# Patient Record
Sex: Male | Born: 2005 | Race: Black or African American | Hispanic: No | Marital: Single | Smoking: Never smoker
Health system: Southern US, Community
[De-identification: ages and names within clinical notes are randomized; demographics above are authoritative.]

## PROBLEM LIST (undated history)

## (undated) DIAGNOSIS — J45909 Unspecified asthma, uncomplicated: Secondary | ICD-10-CM

## (undated) DIAGNOSIS — I514 Myocarditis, unspecified: Secondary | ICD-10-CM

---
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
## 2023-01-15 ENCOUNTER — Ambulatory Visit: Payer: Self-pay | Admitting: Nurse Practitioner

## 2023-02-16 ENCOUNTER — Other Ambulatory Visit: Payer: Self-pay

## 2023-02-16 ENCOUNTER — Emergency Department (HOSPITAL_COMMUNITY)
Admission: EM | Admit: 2023-02-16 | Discharge: 2023-02-17 | Disposition: A | Discharge status: Home / Self Care | Payer: Medicaid Other | Attending: Emergency Medicine | Admitting: Emergency Medicine

## 2023-02-16 ENCOUNTER — Emergency Department (HOSPITAL_COMMUNITY): Payer: Medicaid Other

## 2023-02-16 ENCOUNTER — Encounter (HOSPITAL_COMMUNITY): Payer: Self-pay | Admitting: *Deleted

## 2023-02-16 DIAGNOSIS — R079 Chest pain, unspecified: Secondary | ICD-10-CM | POA: Diagnosis present

## 2023-02-16 DIAGNOSIS — R197 Diarrhea, unspecified: Secondary | ICD-10-CM | POA: Diagnosis not present

## 2023-02-16 LAB — BASIC METABOLIC PANEL
Anion gap: 12 (ref 5–15)
BUN: 10 mg/dL (ref 6–20)
CO2: 26 mmol/L (ref 22–32)
Calcium: 10.1 mg/dL (ref 8.9–10.3)
Chloride: 101 mmol/L (ref 98–111)
Creatinine, Ser: 1.09 mg/dL (ref 0.61–1.24)
GFR, Estimated: >60 mL/min (ref >60)
Glucose, Bld: 94 mg/dL (ref 70–99)
Potassium: 4.1 mmol/L (ref 3.5–5.1)
Sodium: 139 mmol/L (ref 135–145)

## 2023-02-16 LAB — CBC
HCT: 48.2 % (ref 39.0–52.0)
Hemoglobin: 16.1 g/dL (ref 13.0–17.0)
MCH: 28.8 pg (ref 26.0–34.0)
MCHC: 33.4 g/dL (ref 30.0–36.0)
MCV: 86.1 fL (ref 80.0–100.0)
Platelets: 332 10*3/uL (ref 150–400)
RBC: 5.6 MIL/uL (ref 4.22–5.81)
RDW: 12.9 % (ref 11.5–15.5)
WBC: 7.6 10*3/uL (ref 4.0–10.5)
nRBC: 0 % (ref 0.0–0.2)

## 2023-02-16 LAB — TROPONIN I (HIGH SENSITIVITY)
Troponin I (High Sensitivity): 5 ng/L (ref <18)
Troponin I (High Sensitivity): 6 ng/L (ref <18)

## 2023-02-16 NOTE — ED Triage Notes (Signed)
 The pt has had chest pain for 4 days  he has a history of myocarditis  he is followed by [facility] hospital

## 2023-02-17 NOTE — ED Provider Notes (Signed)
 [place] EMERGENCY DEPARTMENT AT [facility] Provider Note   CSN: [id number] Arrival date & time: 02/16/23  8161     History  Chief Complaint  Patient presents with   Chest Pain    Kevin Holloway is a 18 y.o. male.  The history is provided by the patient.  Chest Pain She has history of hyperlipidemia, patent ductus arteriosus, myocarditis and comes in complaining of chest pains for the last 3 days.  Pain is present primarily when he wakes up.  He states it is left parasternal and is a dull, achy pain.  He takes a dose of aspirin , and pain will go away.  Pain is not worsened by deep breath, movement, exertion, change in body position.  He denies fever, chills, sweats.  He denies nausea, vomiting, dyspnea.  He did have some loose stools 2 days ago, but has not had any diarrhea since then.   Home Medications Prior to Admission medications   Medication Sig Start Date End Date Taking? Authorizing Provider  albuterol  (VENTOLIN  HFA) 108 (90 Base) MCG/ACT inhaler Inhale 2 puffs into the lungs every 4 (four) hours as needed. 11/24/18   [provider]  beclomethasone (QVAR) 40 MCG/ACT inhaler Inhale 2 puffs into the lungs as needed.    [provider]  FLOVENT  HFA 44 MCG/ACT inhaler Inhale 2 puffs into the lungs 2 (two) times daily. 02/03/19   [provider]  fluticasone  (VERAMYST) 27.5 MCG/SPRAY nasal spray Place 2 sprays into the nose daily.    [provider]  montelukast (SINGULAIR) 5 MG chewable tablet Chew 5 mg by mouth at bedtime.    [provider]  ondansetron  (ZOFRAN ) 4 MG tablet Take 1 tablet (4 mg total) by mouth every 8 (eight) hours as needed for nausea or vomiting. 02/09/20   Maranda Jamee Jacob, MD  UNKNOWN TO PATIENT Eczema cream    [provider]  cetirizine  (ZYRTEC ) 10 MG chewable tablet Chew 1 tablet (10 mg total) by mouth daily. 04/16/18 02/09/20  Wurst, Laymon, PA-C  famotidine  (PEPCID ) 20 MG tablet Take 1  tablet (20 mg total) by mouth 2 (two) times daily. 03/11/19 02/09/20  Van Knee, MD  fluticasone  (FLONASE ) 50 MCG/ACT nasal spray Place 1 spray into both nostrils daily. 04/16/18 02/09/20  Martell Laymon, PA-C      Allergies    Patient has no known allergies.    Review of Systems   Review of Systems  Cardiovascular:  Positive for chest pain.  All other systems reviewed and are negative.   Physical Exam Updated Vital Signs BP 138/82 (BP Location: Left Arm)   Pulse 83   Temp 98.2 F (36.8 C) (Oral)   Resp 15   Ht 5' 9 (1.753 m)   Wt 94.7 kg   SpO2 100%   BMI 30.83 kg/m  Physical Exam Vitals and nursing note reviewed.   18 year old male, resting comfortably and in no acute distress. Vital signs are normal. Oxygen saturation is 100%, which is normal. Head is normocephalic and atraumatic. PERRLA, EOMI. Oropharynx is clear. Neck is nontender and supple. Back is nontender. Lungs are clear without rales, wheezes, or rhonchi. Chest is nontender. Heart has regular rate and rhythm without murmur. Abdomen is soft, flat, nontender. Extremities have no cyanosis or edema, full range of motion is present. Skin is warm and dry without rash. Neurologic: Mental status is normal, moves all extremities equally.  ED Results / Procedures / Treatments   Labs (all labs  ordered are listed, but only abnormal results are displayed) Labs Reviewed  BASIC METABOLIC PANEL  CBC  TROPONIN I (HIGH SENSITIVITY)  TROPONIN I (HIGH SENSITIVITY)    EKG EKG Interpretation Date/Time:  Saturday February 16 2023 19:02:34 EST Ventricular Rate:  81 PR Interval:  124 QRS Duration:  94 QT Interval:  348 QTC Calculation: 404 R Axis:   78  Text Interpretation: Normal sinus rhythm with sinus arrhythmia Inferior infarct , age undetermined Abnormal ECG When compared with ECG of 18-Aug-2022 18:25, Inferior Q waves are less prominent T wave abnormality is less prominent Confirmed by Raford Lenis ([id number]) on  02/17/2023 12:03:16 AM  Radiology DG Chest 2 View Result Date: 02/16/2023 CLINICAL DATA:  Chest pain for 4 days. EXAM: CHEST - 2 VIEW COMPARISON:  08/18/2022 FINDINGS: Normal heart size and pulmonary vascularity. No focal airspace disease or consolidation in the lungs. No blunting of costophrenic angles. No pneumothorax. Mediastinal contours appear intact. IMPRESSION: No active cardiopulmonary disease. Electronically Signed   By: Elsie Gravely M.D.   On: 02/16/2023 19:39    Procedures Procedures  Cardiac monitor shows normal sinus rhythm, per my interpretation.  Medications Ordered in ED Medications - No data to display  ED Course/ Medical Decision Making/ A&P                                 Medical Decision Making  Nonspecific chest pain.  Etiology is unclear.  Doubt ACS, pulmonary embolism, pneumonia.  History of myocarditis, but this pain is not typical of myocarditis.  I have reviewed his electrocardiogram, and my interpretation is nonspecific T wave changes which are improved compared with prior.  Chest x-ray shows no acute cardiopulmonary process.  Have independently viewed the images, and agree with radiologist's interpretation.  I reviewed his laboratory test, my interpretation is normal CBC, basic metabolic panel, troponin x 2.  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  I have reassured the patient and his mother that there is no sign of recurren myocarditis, advised to continue symptomatic treatment with over-the-counter NSAIDs and acetaminophen .  Return precautions discussed.  Final Clinical Impression(s) / ED Diagnoses Final diagnoses:  Nonspecific chest pain    Rx / DC Orders ED Discharge Orders     None         Raford Lenis, MD 02/17/23 0020

## 2023-02-17 NOTE — Discharge Instructions (Signed)
 Your evaluation did not show any sign of serious problems causing your pain, but the specific cause is not clear.  You may take aspirin , ibuprofen , or naproxen as needed for pain.  If you need additional pain relief, you may add acetaminophen .  Return to the emergency department if you have new or concerning symptoms.

## 2023-05-13 ENCOUNTER — Other Ambulatory Visit: Payer: Self-pay

## 2023-05-13 ENCOUNTER — Ambulatory Visit: Payer: Medicaid Other | Attending: Family Medicine | Admitting: Family Medicine

## 2023-05-13 ENCOUNTER — Encounter: Payer: Self-pay | Admitting: Family Medicine

## 2023-05-13 VITALS — BP 118/79 | HR 61 | Ht 69.0 in | Wt 210.0 lb

## 2023-05-13 DIAGNOSIS — Z1159 Encounter for screening for other viral diseases: Secondary | ICD-10-CM

## 2023-05-13 DIAGNOSIS — Z131 Encounter for screening for diabetes mellitus: Secondary | ICD-10-CM | POA: Diagnosis not present

## 2023-05-13 DIAGNOSIS — G4709 Other insomnia: Secondary | ICD-10-CM

## 2023-05-13 DIAGNOSIS — M549 Dorsalgia, unspecified: Secondary | ICD-10-CM | POA: Insufficient documentation

## 2023-05-13 DIAGNOSIS — J45909 Unspecified asthma, uncomplicated: Secondary | ICD-10-CM | POA: Diagnosis not present

## 2023-05-13 DIAGNOSIS — Z13228 Encounter for screening for other metabolic disorders: Secondary | ICD-10-CM

## 2023-05-13 DIAGNOSIS — Z139 Encounter for screening, unspecified: Secondary | ICD-10-CM

## 2023-05-13 DIAGNOSIS — Z8679 Personal history of other diseases of the circulatory system: Secondary | ICD-10-CM | POA: Insufficient documentation

## 2023-05-13 DIAGNOSIS — L309 Dermatitis, unspecified: Secondary | ICD-10-CM | POA: Insufficient documentation

## 2023-05-13 MED ORDER — CETIRIZINE HCL 10 MG PO CHEW: 10.0000 mg | CHEWABLE_TABLET | Freq: Every day | ORAL | Status: AC

## 2023-05-13 MED ORDER — ALBUTEROL SULFATE HFA 108 (90 BASE) MCG/ACT IN AERS
2.0000 | INHALATION_SPRAY | RESPIRATORY_TRACT | 6 refills | Status: AC | PRN
  Filled 2023-05-13: qty 18, 17d supply, fill #0

## 2023-05-13 NOTE — Progress Notes (Signed)
 Subjective:  Patient ID: Kevin Holloway, male    DOB: December 03, 2005  Age: 18 y.o. MRN: [id number]  CC: New Patient (Initial Visit) (Back pain/Requesting lab work/)     Discussed the use of AI scribe software for clinical note transcription with the patient, who gave verbal consent to proceed.  History of Present Illness The patient, with a history of asthma, eczema and myocarditis, is establishing care with the new clinic. The patient has been experiencing back pain for about two years, which worsens with standing or walking for more than eight minutes. The pain, rated as a 5/10, is located in the middle of the lower back and does not radiate down the legs. The patient finds relief by sitting and resting.  The patient's mother also reports that the patient has been having sleep issues.  He is unable to fall asleep until early hours of the morning stays asleep until 4 into the morning.  Denies caffeine intake or daytime naps.  The patient has a history of chest pain and was seen in the emergency room for this in 02/2023 and myocarditis was ruled out at that time.   For his eczema he uses triamcinolone and for his asthma he is on Ventolin as needed but not on any maintenance treatment.  He uses cetirizine and Flonase for seasonal allergies.    Past Medical History:  Diagnosis Date   Asthma    Myocarditis (HCC)     No past surgical history on file.  Family History  Problem Relation Age of Onset   Healthy Mother    Healthy Father    Cancer Paternal Grandmother     Social History   Socioeconomic History   Marital status: Single    Spouse name: Not on file   Number of children: Not on file   Years of education: Not on file   Highest education level: Not on file  Occupational History   Not on file  Tobacco Use   Smoking status: Never   Smokeless tobacco: Never  Vaping Use   Vaping status: Never Used  Substance and Sexual Activity   Alcohol use: Never   Drug use: Not on file    Sexual activity: Never  Other Topics Concern   Not on file  Social History Narrative   Not on file   Social Drivers of Health   Financial Resource Strain: Medium Risk (05/13/2023)   Overall Financial Resource Strain (CARDIA)    Difficulty of Paying Living Expenses: Somewhat hard  Food Insecurity: Food Insecurity Present (05/13/2023)   Hunger Vital Sign    Worried About Running Out of Food in the Last Year: Sometimes true    Ran Out of Food in the Last Year: Sometimes true  Transportation Needs: Unmet Transportation Needs (05/13/2023)   PRAPARE - Transportation    Lack of Transportation (Medical): Yes    Lack of Transportation (Non-Medical): Yes  Physical Activity: Patient Declined (05/13/2023)   Exercise Vital Sign    Days of Exercise per Week: Patient declined    Minutes of Exercise per Session: Patient declined  Stress: Stress Concern Present (05/13/2023)   [employer] of Occupational Health - Occupational Stress Questionnaire    Feeling of Stress : To some extent  Social Connections: Moderately Integrated (05/13/2023)   Social Connection and Isolation Panel [NHANES]    Frequency of Communication with Friends and Family: Twice a week    Frequency of Social Gatherings with Friends and Family: Twice a week  Attends Religious Services: 1 to 4 times per year    Active Member of Clubs or Organizations: Yes    Attends Banker Meetings: Never    Marital Status: Never married    No Known Allergies  Outpatient Medications Prior to Visit  Medication Sig Dispense Refill   fluticasone (VERAMYST) 27.5 MCG/SPRAY nasal spray Place 2 sprays into the nose daily.     ondansetron (ZOFRAN) 4 MG tablet Take 1 tablet (4 mg total) by mouth every 8 (eight) hours as needed for nausea or vomiting. 12 tablet 0   UNKNOWN TO PATIENT Eczema cream     albuterol (VENTOLIN HFA) 108 (90 Base) MCG/ACT inhaler Inhale 2 puffs into the lungs every 4 (four) hours as needed.     beclomethasone  (QVAR) 40 MCG/ACT inhaler Inhale 2 puffs into the lungs as needed.     FLOVENT HFA 44 MCG/ACT inhaler Inhale 2 puffs into the lungs 2 (two) times daily.     montelukast (SINGULAIR) 5 MG chewable tablet Chew 5 mg by mouth at bedtime.     No facility-administered medications prior to visit.     ROS Review of Systems  Constitutional:  Negative for activity change and appetite change.  HENT:  Negative for sinus pressure and sore throat.   Respiratory:  Negative for chest tightness, shortness of breath and wheezing.   Cardiovascular:  Negative for chest pain and palpitations.  Gastrointestinal:  Negative for abdominal distention, abdominal pain and constipation.  Genitourinary: Negative.   Musculoskeletal:  Positive for back pain.  Skin:  Positive for rash.  Psychiatric/Behavioral:  Negative for behavioral problems and dysphoric mood.     Objective:  BP 118/79   Pulse 61   Ht 5\' 9"  (1.753 m)   Wt 210 lb (95.3 kg)   SpO2 100%   BMI 31.01 kg/m      05/13/2023    8:48 AM 02/17/2023   12:10 AM 02/16/2023   10:57 PM  BP/Weight  Systolic BP 118 132 138  Diastolic BP 79 83 82  Wt. (Lbs) 210    BMI 31.01 kg/m2        Physical Exam Constitutional:      Appearance: He is well-developed.  HENT:     Right Ear: There is impacted cerumen.     Left Ear: There is impacted cerumen.     Mouth/Throat:     Mouth: Mucous membranes are moist.  Cardiovascular:     Rate and Rhythm: Normal rate.     Heart sounds: Normal heart sounds. No murmur heard. Pulmonary:     Effort: Pulmonary effort is normal.     Breath sounds: Normal breath sounds. No wheezing or rales.  Chest:     Chest wall: No tenderness.  Abdominal:     General: Bowel sounds are normal. There is no distension.     Palpations: Abdomen is soft. There is no mass.     Tenderness: There is no abdominal tenderness.  Musculoskeletal:        General: Normal range of motion.     Right lower leg: No edema.     Left lower leg: No  edema.     Comments: No tenderness on lumbar spine palpation, negative straight leg raise bilaterally  Skin:    Comments: Lower back with hyperpigmented rough scaly rash  Neurological:     Mental Status: He is alert and oriented to person, place, and time.  Psychiatric:        Mood and  Affect: Mood normal.        Latest Ref Rng & Units 02/16/2023    7:33 PM 08/18/2022    6:55 PM 10/06/2021    9:15 PM  CMP  Glucose 70 - 99 mg/dL 94  132  440   BUN 6 - 20 mg/dL 10  7  12    Creatinine 0.61 - 1.24 mg/dL 1.02  7.25  3.66   Sodium 135 - 145 mmol/L 139  135  138   Potassium 3.5 - 5.1 mmol/L 4.1  3.8  3.9   Chloride 98 - 111 mmol/L 101  99  104   CO2 22 - 32 mmol/L 26  26  28    Calcium 8.9 - 10.3 mg/dL 44.0  9.5  9.9     Lipid Panel  No results found for: "CHOL", "TRIG", "HDL", "CHOLHDL", "VLDL", "LDLCALC", "LDLDIRECT"  CBC    Component Value Date/Time   WBC 7.6 02/16/2023 1933   RBC 5.60 02/16/2023 1933   HGB 16.1 02/16/2023 1933   HCT 48.2 02/16/2023 1933   PLT 332 02/16/2023 1933   MCV 86.1 02/16/2023 1933   MCH 28.8 02/16/2023 1933   MCHC 33.4 02/16/2023 1933   RDW 12.9 02/16/2023 1933   LYMPHSABS 1.8 10/06/2021 2115   MONOABS 0.6 10/06/2021 2115   EOSABS 0.1 10/06/2021 2115   BASOSABS 0.0 10/06/2021 2115    No results found for: "HGBA1C"     Assessment & Plan History of Myocarditis No current chest pain. Last cardiologist visit last year.  ED visit was in 02/2023 for chest pain but myocarditis was ruled out - Order comprehensive metabolic panel, cholesterol screening, diabetes screening, hepatitis C screening, and HIV screening.  Chronic lower back pain Chronic pain rated 5/10, exacerbated by standing or walking, relieved by rest. Differential includes musculoskeletal issues or structural abnormalities. - Order back x-ray. - Consider physical therapy if x-ray is normal. - Refer to orthopedics if x-ray shows abnormalities. - Advise Tylenol or ibuprofen for pain  management if needed.  Asthma Asthma controlled with Ventolin. Previous prescriptions for Flovent and montelukast not in use. - Refill Ventolin inhaler. - Continue fluticasone nasal spray and cetirizine as needed for seasonal allergies  Eczema Eczema managed with triamcinolone ointment.   Insomnia Explained the sleep pattern of teenage years and we have discussed sleep hygiene including setting a card of time with regards to use of devices as he has been using devices late into the wee hours of the morning. - Encourage exercise in the late afternoon/early evening - Can use melatonin if symptoms persist  Follow-up Plan to assess effectiveness of physical therapy and overall health status. - Schedule follow-up in six months. - Advise to contact clinic for any issues or earlier appointment if needed.      Meds ordered this encounter  Medications   albuterol (VENTOLIN HFA) 108 (90 Base) MCG/ACT inhaler    Sig: Inhale 2 puffs into the lungs every 4 (four) hours as needed.    Dispense:  18 g    Refill:  6   cetirizine (ZYRTEC) 10 MG chewable tablet    Sig: Chew 1 tablet (10 mg total) by mouth daily.    Follow-up: Return in about 6 months (around 11/12/2023) for Back pain.       Hoy Register, MD, FAAFP. [facility] [place], [place] [phone]   05/13/2023, 10:01 AM

## 2023-05-13 NOTE — Patient Instructions (Signed)
 VISIT SUMMARY:  You visited our clinic today to establish care and discuss several health concerns, including back pain, asthma, and a history of myocarditis. We reviewed your symptoms and medical history, and we have outlined a plan to address each of your issues.  YOUR PLAN:  -MYOCARDITIS: Myocarditis is an inflammation of the heart muscle. You are not currently experiencing chest pain, but we will conduct a comprehensive metabolic panel, cholesterol screening, diabetes screening, hepatitis C screening, and HIV screening to monitor your overall health.  Please follow-up with your cardiologist.  -CHRONIC LOWER BACK PAIN: Chronic lower back pain can be due to musculoskeletal issues or structural abnormalities. We will order a back x-ray to investigate the cause. If the x-ray is normal, we may consider physical therapy. If there are abnormalities, we will refer you to an orthopedic specialist. You can take Tylenol or ibuprofen for pain relief as needed.  -ASTHMA: Asthma is a condition where your airways narrow and swell, making it difficult to breathe. Your asthma is currently controlled with Ventolin. We will refill your Ventolin inhaler and you should continue using your fluticasone nasal spray and cetirizine.  -ECZEMA: Eczema is a condition that makes your skin red and itchy. You should continue managing it with triamcinolone ointment.  INSTRUCTIONS:  We will schedule a follow-up appointment in six months to assess the effectiveness of physical therapy and your overall health status. Please contact the clinic if you have any issues or need an earlier appointment.

## 2023-05-14 LAB — HEMOGLOBIN A1C
Est. average glucose Bld gHb Est-mCnc: 114 mg/dL
Hgb A1c MFr Bld: 5.6 % (ref 4.8–5.6)

## 2023-05-14 LAB — CBC WITH DIFFERENTIAL/PLATELET
Basophils Absolute: 0 10*3/uL (ref 0.0–0.2)
Basos: 1 %
EOS (ABSOLUTE): 0.2 10*3/uL (ref 0.0–0.4)
Eos: 2 %
Hematocrit: 46.2 % (ref 37.5–51.0)
Hemoglobin: 15.4 g/dL (ref 13.0–17.7)
Immature Grans (Abs): 0 10*3/uL (ref 0.0–0.1)
Immature Granulocytes: 0 %
Lymphocytes Absolute: 2.7 10*3/uL (ref 0.7–3.1)
Lymphs: 36 %
MCH: 28.7 pg (ref 26.6–33.0)
MCHC: 33.3 g/dL (ref 31.5–35.7)
MCV: 86 fL (ref 79–97)
Monocytes Absolute: 0.6 10*3/uL (ref 0.1–0.9)
Monocytes: 8 %
Neutrophils Absolute: 4 10*3/uL (ref 1.4–7.0)
Neutrophils: 53 %
Platelets: 341 10*3/uL (ref 150–450)
RBC: 5.36 x10E6/uL (ref 4.14–5.80)
RDW: 12.5 % (ref 11.6–15.4)
WBC: 7.5 10*3/uL (ref 3.4–10.8)

## 2023-05-14 LAB — CMP14+EGFR
ALT: 44 IU/L (ref 0–44)
AST: 30 IU/L (ref 0–40)
Albumin: 4.9 g/dL (ref 4.3–5.2)
Alkaline Phosphatase: 88 IU/L (ref 51–125)
BUN/Creatinine Ratio: 12 (ref 9–20)
BUN: 13 mg/dL (ref 6–20)
Bilirubin Total: 0.3 mg/dL (ref 0.0–1.2)
CO2: 22 mmol/L (ref 20–29)
Calcium: 10 mg/dL (ref 8.7–10.2)
Chloride: 102 mmol/L (ref 96–106)
Creatinine, Ser: 1.05 mg/dL (ref 0.76–1.27)
Globulin, Total: 2.8 g/dL (ref 1.5–4.5)
Glucose: 89 mg/dL (ref 70–99)
Potassium: 4.4 mmol/L (ref 3.5–5.2)
Sodium: 139 mmol/L (ref 134–144)
Total Protein: 7.7 g/dL (ref 6.0–8.5)
eGFR: 105 mL/min/{1.73_m2} (ref >59)

## 2023-05-14 LAB — HIV ANTIBODY (ROUTINE TESTING W REFLEX): HIV Screen 4th Generation wRfx: NONREACTIVE

## 2023-05-14 LAB — LP+NON-HDL CHOLESTEROL
Cholesterol, Total: 205 mg/dL — ABNORMAL HIGH (ref 100–169)
HDL: 41 mg/dL (ref >39)
LDL Chol Calc (NIH): 135 mg/dL — ABNORMAL HIGH (ref 0–109)
Total Non-HDL-Chol (LDL+VLDL): 164 mg/dL — ABNORMAL HIGH (ref 0–119)
Triglycerides: 162 mg/dL — ABNORMAL HIGH (ref 0–89)
VLDL Cholesterol Cal: 29 mg/dL (ref 5–40)

## 2023-05-14 LAB — HCV AB W REFLEX TO QUANT PCR: HCV Ab: NONREACTIVE

## 2023-05-14 LAB — HCV INTERPRETATION

## 2023-05-16 ENCOUNTER — Telehealth: Payer: Self-pay | Admitting: *Deleted

## 2023-05-16 NOTE — Progress Notes (Unsigned)
 Complex Care Management Note Care Guide Note  05/16/2023 Name: Kevin Holloway MRN: [id number] DOB: 09-Oct-2005   Complex Care Management Outreach Attempts: An unsuccessful telephone outreach was attempted today to offer the patient information about available complex care management services.  Follow Up Plan:  Additional outreach attempts will be made to offer the patient complex care management information and services.   Encounter Outcome:  No Answer  Gwenevere Ghazi  [facility] Health  [facility], [facility] Guide  Direct Dial: [phone]  Fax [phone]

## 2023-05-17 NOTE — Progress Notes (Signed)
 Complex Care Management Note Care Guide Note  05/17/2023 Name: Kevin Holloway MRN: [id number] DOB: December 11, 2005   Complex Care Management Outreach Attempts: A second unsuccessful outreach was attempted today to offer the patient with information about available complex care management services.  Follow Up Plan:  Additional outreach attempts will be made to offer the patient complex care management information and services.   Encounter Outcome:  No Answer  Gwenevere Ghazi  [facility] Health  [facility], [facility] Guide  Direct Dial: [phone]  Fax [phone]

## 2023-05-17 NOTE — Progress Notes (Signed)
 Complex Care Management Note  Care Guide Note 05/17/2023 Name: Kevin Holloway MRN: [id number] DOB: 02-14-2005  [name] is a 18 y.o. year old male who sees [facility] for primary care. I reached out to [name] by phone today to offer complex care management services.  [name] was given information about Complex Care Management services today including:   The Complex Care Management services include support from the care team which includes your Nurse Care Manager, Clinical Social Worker, or Pharmacist.  The Complex Care Management team is here to help remove barriers to the health concerns and goals most important to you. Complex Care Management services are voluntary, and the patient may decline or stop services at any time by request to their care team member.   Complex Care Management Consent Status: Patient agreed to services and verbal consent obtained.   Follow up plan:  Telephone appointment with complex care management team member scheduled for:  4/18  Referral routed to community resource care guides   Encounter Outcome:  Patient Scheduled  Gwenevere Ghazi  [facility] Health  [facility], [facility] Guide  Direct Dial: [phone]  Fax [phone]

## 2023-05-22 ENCOUNTER — Telehealth: Payer: Self-pay | Admitting: *Deleted

## 2023-05-22 NOTE — Progress Notes (Addendum)
 Complex Care Management Note Care Guide Note  05/22/2023 Name: Kevin Holloway MRN: [id number] DOB: 07/10/2005   Complex Care Management Outreach Attempts: An unsuccessful telephone outreach was attempted today to offer the patient information about available complex care management services.  Follow Up Plan:  Additional outreach attempts will be made to offer the patient complex care management information and services.   Encounter Outcome:  No Answer  [facility] Care Guide  Direct Dial:[phone] Fax:[phone] Website: [email or web address]

## 2023-05-24 ENCOUNTER — Other Ambulatory Visit: Payer: Self-pay

## 2023-05-24 NOTE — Patient Outreach (Signed)
 Complex Care Management   Visit Note  05/24/2023  Name:  Kevin Holloway MRN: [id number] DOB: 11/25/2005  Situation: Referral received for Complex Care Management related to SDOH Barriers:  Food insecurity Lack of essential utilities , lack of motivation for self-care.  Caregiver  and    I obtained verbal consent from Caregiver Parent.  Visit completed with caregiver/mother Terri Fester  on the phone  Background:   Past Medical History:  Diagnosis Date   Asthma    Myocarditis [facility])     Assessment: Patient Reported Symptoms:  Cognitive        Neurological Neurological Review of Symptoms: No symptoms reported    HEENT HEENT Symptoms Reported: No symptoms reported      Cardiovascular Cardiovascular Symptoms Reported: No symptoms reported    Respiratory Respiratory Symptoms Reported: No symptoms reported    Endocrine Patient reports the following symptoms related to hypoglycemia or hyperglycemia : No symptoms reported Is patient diabetic?: No    Gastrointestinal Gastrointestinal Symptoms Reported: No symptoms reported      Genitourinary Genitourinary Symptoms Reported: No symptoms reported    Integumentary Integumentary Symptoms Reported: No symptoms reported    Musculoskeletal Musculoskelatal Symptoms Reviewed: No symptoms reported   Falls in the past year?: No    Psychosocial Psychosocial Symptoms Reported: Other (Mother states "it's difficult to get him out of his room", she prepares all meals for him, he is not motivated to get his license. He was discharged from public school around age of 66, she has been trying to help him apply for disability.)     Quality of Family Relationships: supportive, helpful, involved      05/24/2023    4:58 PM  Depression screen PHQ 2/9  Decreased Interest 2  Down, Depressed, Hopeless 0  PHQ - 2 Score 2  Altered sleeping 1  Change in appetite 0  Feeling bad or failure about yourself  0  Trouble concentrating 0  Moving  slowly or fidgety/restless 0  Suicidal thoughts 0  PHQ-9 Score 3    There were no vitals filed for this visit.  Medications Reviewed Today     Reviewed by Isadore Marble, RN (Registered Nurse) on 05/24/23 at 1652  Med List Status: <None>   Medication Order Taking? Sig Documenting Provider Last Dose Status Informant  albuterol  (VENTOLIN  HFA) 108 (90 Base) MCG/ACT inhaler [id number]  Inhale 2 puffs into the lungs every 4 (four) hours as needed. Newlin, Enobong, MD  Active   cetirizine  (ZYRTEC ) 10 MG chewable tablet [id number]  Chew 1 tablet (10 mg total) by mouth daily. Newlin, Enobong, MD  Active     Discontinued 02/09/20 1801   Discontinued 02/09/20 1801   fluticasone  (VERAMYST) 27.5 MCG/SPRAY nasal spray [id number] No Place 2 sprays into the nose daily. [provider] Taking Active   ondansetron  (ZOFRAN ) 4 MG tablet [id number] No Take 1 tablet (4 mg total) by mouth every 8 (eight) hours as needed for nausea or vomiting. Stephany Ehrich, MD Taking Active   UNKNOWN TO PATIENT [id number] No Eczema cream [provider] Taking Active             Recommendation:   Referral to: [facility] Guide for food and utility resources.  Referral to LCSW for counseling due to caregiver's concern of patient's lack of motivation to perform self-care activities.   Follow Up Plan:   Telephone follow-up Friday, May 2nd, 1:00pm.   Naval architect, CCM [place]  VBCI Population Health RN Care  Insurance underwriter Dial: [phone]  Fax: [phone]

## 2023-05-27 ENCOUNTER — Telehealth: Payer: Self-pay | Admitting: *Deleted

## 2023-05-27 NOTE — Progress Notes (Signed)
 Complex Care Management Note Care Guide Note  05/27/2023 Name: Kevin Holloway MRN: [id number] DOB: 2006/01/19   Complex Care Management Outreach Attempts: A second unsuccessful outreach was attempted today to offer the patient with information about available complex care management services.  Follow Up Plan:  Additional outreach attempts will be made to offer the patient complex care management information and services.   Encounter Outcome:  No Answer  [facility] Care Guide  Direct Dial:[phone] Fax:[phone] Website: [email or web address]

## 2023-05-28 ENCOUNTER — Telehealth: Payer: Self-pay | Admitting: *Deleted

## 2023-05-28 NOTE — Progress Notes (Signed)
 Complex Care Management Note Care Guide Note  05/28/2023 Name: Kevin Holloway MRN: [id number] DOB: 2005/06/04   Complex Care Management Outreach Attempts: A second unsuccessful outreach was attempted today to offer the patient with information about available complex care management services.  Follow Up Plan:  Additional outreach attempts will be made to offer the patient complex care management information and services.   Encounter Outcome:  No Answer  JGM@ Referral Received

## 2023-05-29 ENCOUNTER — Telehealth: Payer: Self-pay | Admitting: *Deleted

## 2023-05-29 NOTE — Progress Notes (Signed)
 Complex Care Management Note Care Guide Note  05/29/2023 Name: ISAID SALVIA MRN: [id number] DOB: Dec 13, 2005   Complex Care Management Outreach Attempts: An unsuccessful telephone outreach was attempted today to offer the patient information about available complex care management services.  Follow Up Plan:  Additional outreach attempts will be made to offer the patient complex care management information and services.   Encounter Outcome:  No Answer [facility] Care Guide  Direct Dial:[phone] Fax:[phone] Website: [email or web address]

## 2023-06-05 ENCOUNTER — Ambulatory Visit
Admission: RE | Admit: 2023-06-05 | Discharge: 2023-06-05 | Disposition: A | Discharge status: Home / Self Care | Source: Ambulatory Visit | Attending: Family Medicine | Admitting: Family Medicine

## 2023-06-05 DIAGNOSIS — M549 Dorsalgia, unspecified: Secondary | ICD-10-CM

## 2023-06-07 ENCOUNTER — Other Ambulatory Visit: Payer: Self-pay

## 2023-06-07 ENCOUNTER — Other Ambulatory Visit: Payer: Self-pay | Admitting: Family Medicine

## 2023-06-07 DIAGNOSIS — M549 Dorsalgia, unspecified: Secondary | ICD-10-CM

## 2023-06-07 NOTE — Patient Instructions (Signed)
 Visit Information  Kevin Holloway was given information about Medicaid Managed Care team care coordination services as a part of their [employer] Medicaid benefit. Kevin Holloway verbally consentedto engagement with the [facility] Managed Care team.   If you are experiencing a medical emergency, please call 911 or report to your local emergency department or urgent care.   If you have a non-emergency medical problem during routine business hours, please contact your provider's office and ask to speak with a nurse.   For questions related to your [employer] [facility] health plan, please call: [phone]  OR visit the member homepage at: [email or web address]  If you would like to schedule transportation through your [employer] [facility] plan, please call the following number at least 2 days in advance of your appointment: [phone]  If you are experiencing a behavioral health crisis, call the [employer] [place]  Behavioral Health Crisis Line at 1-431-273-9539 [phone]). The line is available 24 hours a day, seven days a week.  If you would like help to quit smoking, call 1-800-QUIT-NOW ((670)074-1168) OR Espaol: 1-855-Djelo-Ya (2-595-638-7564) o para ms informacin haga clic aqu or Text READY to 332-951 to register via text   Kevin Holloway - following are the goals we discussed in your visit today:    Goals Addressed             This Visit's Progress    VBCI RN Care Plan   No change    VBCI RN Care Plan   No change    Problems:  Chronic Disease Management support and education needs related to Lack of motivation for self care  Goal: Over the next 3 months the Patient will demonstrate Improved adherence to prescribed treatment plan for motivation for self-care as evidenced by making appointment with mental health counselor  Interventions:    Discussed plans with patient for ongoing care  management follow up and provided patient with direct contact information for care management team Collaborated with caregiver/mother  regarding obtaining referral with LCSW, informed mother patient would have to speak to me to give permission for LCSW referral since he is 18 years old.Kevin Holloway  She would rather try counseling with [facility],  I provided contact information [phone]).   Discussed plans with patient for ongoing care management follow up and provided patient with direct contact information for care management team Advised mother to discuss patient's mental health with provider as there are no diagnoses listed that correspond with her concerns of his social isolation/inability to thrive in adutlhood Discussed [employer]'s GED program - she states he finished middle school but didn't go to high school, does not have high school diploma or GED Discussed finding a male role model with whom he can spend time - she mentioned he does see an uncle approximately once a month. .   Patient Self-Care Activities:  Call provider office for new concerns or questions  Mother/caregiver will call [facility] for appointment.    Plan:  Telephone follow up appointment with care management team member scheduled for:  06/24/23 3:00pm             Reminder to call for mental health counseling: [facility] in [place] [phone]. Address: Chief Financial Officer at [facility], [address], [place] [address].   The patient verbalized understanding of instructions, educational materials, and care plan provided today and agreed to receive a mailed copy of patient instructions, educational materials, and care plan.   Telephone follow up appointment with Managed Medicaid  care management team member scheduled for: Monday, May 19th at 3:00pm with Jurline Olmsted, RN.   Jurline Olmsted BSN, CCM [place]  VBCI Population Health RN Care Manager Direct Dial: [phone]  Fax:  [phone]   Following is a copy of your plan of care:  There are no care plans that you recently modified to display for this patient.

## 2023-06-07 NOTE — Patient Outreach (Signed)
 Complex Care Management   Visit Note  06/07/2023  Name:  Kevin Holloway MRN: [id number] DOB: February 03, 2006  Situation: Referral received for Complex Care Management related to  Asthma, inability to thrive into adulthood  I obtained verbal consent from Parent.  Visit completed with parent/caregiver, Terri Fester  on the phone  Background:   Past Medical History:  Diagnosis Date   Asthma    Myocarditis [facility])     Assessment: Patient Reported Symptoms:  Cognitive Cognitive Status: Requires Assistance Decision Making (Mother reports he stays in his room most of the time, he doesn't have a job, didn't finish high school.)      Neurological Neurological Review of Symptoms: No symptoms reported    HEENT HEENT Symptoms Reported: No symptoms reported      Cardiovascular      Respiratory Respiratory Symptoms Reported: No symptoms reported    Endocrine Patient reports the following symptoms related to hypoglycemia or hyperglycemia : No symptoms reported    Gastrointestinal Gastrointestinal Symptoms Reported: No symptoms reported      Genitourinary Genitourinary Symptoms Reported: No symptoms reported    Integumentary Integumentary Symptoms Reported: No symptoms reported    Musculoskeletal Musculoskelatal Symptoms Reviewed: Other (Mother states patient complains of back pain if he walks more than 5-6 minutes. Per EPIC note dated today (06/07/23), PCP has placed outpatient PT referal for back pain assessment.)        Psychosocial Psychosocial Symptoms Reported:  (Per mother/caregiver, patient stays in his room most of the time, spends time with an Uncle once a month, goes to his grandmother's occasionally.)            05/24/2023    4:58 PM  Depression screen PHQ 2/9  Decreased Interest 2  Down, Depressed, Hopeless 0  PHQ - 2 Score 2  Altered sleeping 1  Change in appetite 0  Feeling bad or failure about yourself  0  Trouble concentrating 0  Moving slowly or  fidgety/restless 0  Suicidal thoughts 0  PHQ-9 Score 3    There were no vitals filed for this visit.  Medications Reviewed Today     Reviewed by Isadore Marble, RN (Registered Nurse) on 06/07/23 at 1437  Med List Status: <None>   Medication Order Taking? Sig Documenting Provider Last Dose Status Informant  albuterol  (VENTOLIN  HFA) 108 (90 Base) MCG/ACT inhaler [id number] Yes Inhale 2 puffs into the lungs every 4 (four) hours as needed. Newlin, Enobong, MD Taking Active   cetirizine  (ZYRTEC ) 10 MG chewable tablet [id number] Yes Chew 1 tablet (10 mg total) by mouth daily. Newlin, Enobong, MD Taking Active     Discontinued 02/09/20 1801     Discontinued 02/09/20 1801   fluticasone  (VERAMYST) 27.5 MCG/SPRAY nasal spray [id number]  Place 2 sprays into the nose daily. [provider]  Active   ondansetron  (ZOFRAN ) 4 MG tablet [id number]  Take 1 tablet (4 mg total) by mouth every 8 (eight) hours as needed for nausea or vomiting. Stephany Ehrich, MD  Active   UNKNOWN TO PATIENT [id number]  Eczema cream [provider]  Active             Recommendation:   Specialty provider follow-up : This RNCM provided contact information to [facility] for mental health counseling.  Advised caregiver to set up appointment with PCP to discuss patient's inability to thrive into adulthood.  Advised caregiver to encourage socialization with trusted male role Holloway on more regular basis. Educated caregiver about [employer] GED program.  Follow Up Plan:   Telephone follow-up Monday, May 19th at 3:00pm.   Jurline Olmsted BSN, CCM [place]  [facility] Population Health RN Care Manager Direct Dial: [phone]  Fax: [phone]

## 2023-06-21 ENCOUNTER — Ambulatory Visit: Payer: Self-pay

## 2023-06-24 ENCOUNTER — Telehealth: Payer: Self-pay

## 2023-06-24 ENCOUNTER — Other Ambulatory Visit: Payer: Self-pay

## 2023-06-24 NOTE — Telephone Encounter (Signed)
 Pts mother calling regarding recent lab results for patient. Please advise.   Copied from CRM [id number]. Topic: Clinical - Lab/Test Results >> Jun 24, 2023  1:07 PM Leory Rands wrote: Reason for CRM: Patient mother Eveleen Hinds is calling regarding lab results. Would like to know is this serious? CB-

## 2023-06-24 NOTE — Telephone Encounter (Signed)
 Mother is not listed on DPR.

## 2023-07-05 ENCOUNTER — Other Ambulatory Visit: Payer: Self-pay

## 2023-07-05 ENCOUNTER — Telehealth: Payer: Self-pay

## 2023-07-08 ENCOUNTER — Other Ambulatory Visit: Payer: Self-pay

## 2023-07-08 ENCOUNTER — Telehealth: Payer: Self-pay

## 2023-07-16 NOTE — Therapy (Signed)
 OUTPATIENT PHYSICAL THERAPY THORACOLUMBAR EVALUATION   Patient Name: Kevin Holloway MRN: [id number] DOB:Mar 16, 2005, 18 y.o., male Today's Date: 07/17/2023  END OF SESSION:  PT End of Session - 07/17/23 1520     Visit Number 1    Date for PT Re-Evaluation 09/25/23    Authorization Type [place] [employer]    PT Start Time 1525    PT Stop Time 1600    PT Time Calculation (min) 35 min             Past Medical History:  Diagnosis Date   Asthma    Myocarditis (HCC)    History reviewed. No pertinent surgical history. 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 It has been going on for like 3 years. After 10 mins I have to sit down.   PERTINENT HISTORY:  The patient, with a history of asthma, eczema and myocarditis, is establishing care with the new clinic. The patient has been experiencing back pain for about two years, which worsens with standing or walking for more than eight minutes. The pain, rated as a 5/10, is located in the middle of the lower back and does not radiate down the legs. The patient finds relief by sitting and resting.  The patient's mother also reports that the patient has been having sleep issues.  He is unable to fall asleep until early hours of the morning stays asleep until 4 into the morning.  Denies caffeine intake or daytime naps.  The patient has  a history of chest pain and was seen in the emergency room for this in 02/2023 and myocarditis was ruled out at that time.   For his eczema he uses triamcinolone and for his asthma he is on Ventolin  as needed but not on any maintenance treatment.  He uses cetirizine  and Flonase  for seasonal allergies.  PAIN:  Are you having pain? Yes: NPRS scale: 5/10 Pain location: low back  Pain description: ache type of pain Aggravating factors:  standing too long, sitting for too long, walk too much Relieving factors: resting or laying down  PRECAUTIONS: None  RED FLAGS: None   WEIGHT BEARING RESTRICTIONS: No  FALLS:  Has patient fallen in last 6 months? 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  DIAGNOSTIC FINDINGS:  FINDINGS: There is no evidence of lumbar spine fracture. Levocurvature of spine. Intervertebral disc spaces are maintained.   IMPRESSION: No acute fracture or dislocation. Levocurvature of spine.  PATIENT SURVEYS:  Modified Oswestry 13/50 = 26%   COGNITION: Overall cognitive status: Within functional limits for tasks assessed     SENSATION: WFL  MUSCLE LENGTH: Hamstrings: very tight in BLE   POSTURE: No Significant postural limitations  PALPATION: No TTP, some tightness in paraspinals   LUMBAR ROM:   AROM eval  Flexion Distal shin no pain just tight  Extension WFL  Right lateral flexion WFL  Left lateral flexion WFL  Right rotation WFL  Left rotation 75%   (Blank rows = not tested)  LOWER EXTREMITY ROM:  all WFL    LOWER EXTREMITY MMT:  5/5   LUMBAR SPECIAL TESTS:  Straight leg raise test: Positive and FABER test: Negative  FUNCTIONAL  TESTS:  5 times sit to stand: 11s    TREATMENT DATE:   07/17/23  EVAL,HEP                                                                                                                                 PATIENT EDUCATION:  Education details: POC, HEP, anatomy  Person educated: Patient Education method: Explanation Education comprehension: verbalized understanding and returned demonstration  HOME EXERCISE PROGRAM: Access Code: [email or web address] URL: [email or web address] Date: 07/17/2023 Prepared by: Donavon Fudge  Exercises - Supine Lower Trunk Rotation  - 2 x daily - 7 x weekly - 2 sets - 10 reps - Supine Bridge  - 2 x daily - 7 x weekly - 2 sets - 10 reps - Supine Single Knee to Chest Stretch  - 2 x daily - 7 x weekly - 2 sets - 2 reps - 15 hold - Seated Hamstring Stretch  - 1 x daily - 7 x weekly - 2 sets - 2 reps - 15 hold - Standard Plank  - 2 x daily - 7 x weekly - 2 sets - 5 reps - 10 hold  ASSESSMENT:  CLINICAL IMPRESSION: Patient is a 18 y.o. male who was seen today for physical therapy evaluation and treatment for LBP. 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 He reports increase back pain with activity and his standing  and walking tolerance is only about 10 mins. His x-ray shows a slight left curvature in his spine. At eval he presents with tightness in his hamstring and along lumbar paraspinals. He also has core weakness and unable to hold a plank for more than 10s. Patient will benefit from PT to address his low back pain by increasing core and low back strength to improve stability and increase his tolerance.   OBJECTIVE IMPAIRMENTS: decreased ROM, decreased strength, impaired flexibility, and pain.   ACTIVITY LIMITATIONS: carrying, lifting, bending, squatting, and locomotion level  PARTICIPATION LIMITATIONS: community activity, occupation, and yard work  PERSONAL FACTORS: Age, Behavior pattern, and Time since onset of  injury/illness/exacerbation are also affecting patient's functional outcome.   REHAB POTENTIAL: Good  CLINICAL DECISION MAKING: Stable/uncomplicated  EVALUATION COMPLEXITY: Low   GOALS: Goals reviewed with patient? Yes  SHORT TERM GOALS: Target date: 08/21/23  Patient will be independent with initial HEP.  Baseline:  Goal status: INITIAL   LONG TERM GOALS: Target date: 09/25/23  Patient will be independent with advanced/ongoing HEP to improve outcomes and carryover.  Baseline:  Goal status: INITIAL  2.  Patient will report <2/10 pain in low back pain to improve QOL.  Baseline: 5/10 Goal status: INITIAL  3.  Patient will demonstrate full pain free lumbar ROM to perform ADLs.   Baseline: small limitations with flexion and L rotation Goal status: INITIAL  4.  Patient will demonstrate improved core strength as demonstrated by 20s plank. Baseline: 9s at best Goal status: INITIAL  5.  Patient will tolerate 30 min of standing/walking to perform household chores and ADLs. Baseline: 10 min max Goal status: INITIAL   PLAN:  PT FREQUENCY: 1x/week  PT DURATION: 10 weeks  PLANNED INTERVENTIONS: 97110-Therapeutic exercises, 97530- Therapeutic activity, 97112- Neuromuscular re-education, 97535- Self Care, 09811- Manual therapy, G0283- Electrical stimulation (unattended), [phone]- Traction (mechanical), 20560 (1-2 muscles), 20561 (3+ muscles)- Dry Needling, Patient/Family education, Balance training, Stair training, Taping, Joint mobilization, Joint manipulation, Spinal manipulation, Spinal mobilization, Cryotherapy, and Moist heat.  PLAN FOR NEXT SESSION: low back and hip stretching, core strengthening    Donavon Fudge, PT 07/17/2023, 4:04 PM

## 2023-07-17 ENCOUNTER — Ambulatory Visit: Attending: Family Medicine

## 2023-07-17 DIAGNOSIS — M545 Low back pain, unspecified: Secondary | ICD-10-CM | POA: Insufficient documentation

## 2023-07-17 DIAGNOSIS — M5459 Other low back pain: Secondary | ICD-10-CM | POA: Insufficient documentation

## 2023-07-17 DIAGNOSIS — M439 Deforming dorsopathy, unspecified: Secondary | ICD-10-CM | POA: Insufficient documentation

## 2023-07-17 DIAGNOSIS — G8929 Other chronic pain: Secondary | ICD-10-CM | POA: Diagnosis present

## 2023-07-17 DIAGNOSIS — M6281 Muscle weakness (generalized): Secondary | ICD-10-CM | POA: Insufficient documentation

## 2023-07-17 DIAGNOSIS — M549 Dorsalgia, unspecified: Secondary | ICD-10-CM | POA: Insufficient documentation

## 2023-07-31 ENCOUNTER — Encounter: Payer: Self-pay | Admitting: Physical Therapy

## 2023-07-31 ENCOUNTER — Ambulatory Visit: Admitting: Physical Therapy

## 2023-07-31 DIAGNOSIS — M6281 Muscle weakness (generalized): Secondary | ICD-10-CM

## 2023-07-31 DIAGNOSIS — M439 Deforming dorsopathy, unspecified: Secondary | ICD-10-CM

## 2023-07-31 DIAGNOSIS — M5459 Other low back pain: Secondary | ICD-10-CM

## 2023-07-31 DIAGNOSIS — G8929 Other chronic pain: Secondary | ICD-10-CM

## 2023-07-31 NOTE — Therapy (Signed)
 OUTPATIENT PHYSICAL THERAPY THORACOLUMBAR EVALUATION   Patient Name: Kevin Holloway MRN: [id number] DOB:Apr 28, 2005, 18 y.o., male Today's Date: 07/31/2023  END OF SESSION:  PT End of Session - 07/31/23 1515     Visit Number 2    Date for PT Re-Evaluation 09/25/23    PT Start Time 1515    PT Stop Time 1600    PT Time Calculation (min) 45 min    Activity Tolerance Patient tolerated treatment well    Behavior During Therapy [facility] for tasks assessed/performed          Past Medical History:  Diagnosis Date   Asthma    Myocarditis (HCC)    History reviewed. No pertinent surgical history. 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   Musculoskeletal back pain 05/13/2023   Eczema 05/13/2023    PCP: Corrina Sabin  REFERRING PROVIDER: Corrina Sabin  REFERRING DIAG:  M54.9 (ICD-10-CM) - Musculoskeletal back pain    Rationale for Evaluation and Treatment: Rehabilitation  THERAPY DIAG:  Chronic bilateral low back pain without sciatica  Other low back pain  Muscle weakness (generalized)  Curvature of spine  ONSET DATE: 3 years   SUBJECTIVE:                                                                                                                                                                                           SUBJECTIVE STATEMENT:  No change since evaluation, back pain with prolong sitting and standing  If I stand up too long my back starts aching. It has been going on for like 3 years. After 10 mins I have to sit down.   PERTINENT HISTORY:  The patient, with a history of asthma, eczema and myocarditis, is establishing care with the new clinic. The patient has been experiencing back pain for about two years, which worsens with standing or walking for more than eight minutes. The pain, rated as a 5/10, is located in the middle of the lower back and does not radiate down the legs. The patient finds relief by  sitting and resting.  The patient's mother also reports that the patient has been having sleep issues.  He is unable to fall asleep until early hours of the morning stays asleep until 4 into the morning.  Denies caffeine intake or daytime naps.  The patient has a history of chest pain and was seen in the emergency room for this in 02/2023 and myocarditis was ruled out at that time.   For his eczema he uses triamcinolone and for his asthma he is on Ventolin  as needed but  not on any maintenance treatment.  He uses cetirizine  and Flonase  for seasonal allergies.  PAIN:  Are you having pain? Yes: NPRS scale: 0/10 Pain location: low back  Pain description: ache type of pain Aggravating factors: standing too long, sitting for too long, walk too much Relieving factors: resting or laying down  PRECAUTIONS: None  RED FLAGS: None   WEIGHT BEARING RESTRICTIONS: No  FALLS:  Has patient fallen in last 6 months? 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  DIAGNOSTIC FINDINGS:  FINDINGS: There is no evidence of lumbar spine fracture. Levocurvature of spine. Intervertebral disc spaces are maintained.   IMPRESSION: No acute fracture or dislocation. Levocurvature of spine.  PATIENT SURVEYS:  Modified Oswestry 13/50 = 26%   COGNITION: Overall cognitive status: Within functional limits for tasks assessed     SENSATION: WFL  MUSCLE LENGTH: Hamstrings: very tight in BLE   POSTURE: No Significant postural limitations  PALPATION: No TTP, some tightness in paraspinals   LUMBAR ROM:   AROM eval  Flexion Distal shin no pain just tight  Extension WFL  Right lateral flexion WFL   Left lateral flexion WFL  Right rotation WFL  Left rotation 75%   (Blank rows = not tested)  LOWER EXTREMITY ROM:  all WFL    LOWER EXTREMITY MMT:  5/5   LUMBAR SPECIAL TESTS:  Straight leg raise test: Positive and FABER test: Negative  FUNCTIONAL TESTS:  5 times sit to stand: 11s    TREATMENT DATE:  07/31/23 NuStep L 5 x 6 min Sit to stand w/ OHP 2x10 Seated Rows & Lats 35lb 2x10 Shoulder ext 10lb 2x10 Bridges 2x10 LE on Pball bridges, K2C, Oblq Modifies dead bugs 2x5 Bilateral LE stretching to piriformis, HS, K2C, Glute  07/17/23  EVAL,HEP                                                                                                                                 PATIENT EDUCATION:  Education details: POC, HEP, anatomy  Person educated: Patient Education method: Explanation Education comprehension: verbalized understanding and returned demonstration  HOME EXERCISE PROGRAM: Access Code: [email or web address] URL: [email or web address] Date: 07/17/2023 Prepared by: Almetta Fam  Exercises - Supine Lower Trunk Rotation  - 2 x daily - 7 x weekly - 2 sets - 10 reps - Supine Bridge  - 2 x daily - 7 x weekly - 2 sets - 10 reps - Supine Single Knee to Chest Stretch  - 2 x daily - 7 x weekly - 2 sets - 2 reps - 15 hold - Seated Hamstring Stretch  - 1 x daily - 7 x weekly -  2 sets - 2 reps - 15 hold - Standard Plank  - 2 x daily - 7 x weekly - 2 sets - 5 reps - 10 hold  ASSESSMENT:  CLINICAL IMPRESSION: Patient is a 18 y.o. male who was seen today for physical therapy treatment for LBP.  He reports increase back pain with activity and his standing and walking tolerance is only about 10 mins. Introduced pt to some postural core strengthening. Postural weakness present with shoulder extensions. Cues needed for pacing with seated rows. Core weakness with modified dead bugs.  Patient will benefit from PT to address his low back pain by increasing core and low back  strength to improve stability and increase his tolerance.   OBJECTIVE IMPAIRMENTS: decreased ROM, decreased strength, impaired flexibility, and pain.   ACTIVITY LIMITATIONS: carrying, lifting, bending, squatting, and locomotion level  PARTICIPATION LIMITATIONS: community activity, occupation, and yard work  PERSONAL FACTORS: Age, Behavior pattern, and Time since onset of injury/illness/exacerbation are also affecting patient's functional outcome.   REHAB POTENTIAL: Good  CLINICAL DECISION MAKING: Stable/uncomplicated  EVALUATION COMPLEXITY: Low   GOALS: Goals reviewed with patient? Yes  SHORT TERM GOALS: Target date: 08/21/23  Patient will be independent with initial HEP.  Baseline:  Goal status: INITIAL   LONG TERM GOALS: Target date: 09/25/23  Patient will be independent with advanced/ongoing HEP to improve outcomes and carryover.  Baseline:  Goal status: INITIAL  2.  Patient will report <2/10 pain in low back pain to improve QOL.  Baseline: 5/10 Goal status: INITIAL  3.  Patient will demonstrate full pain free lumbar ROM to perform ADLs.   Baseline: small limitations with flexion and L rotation Goal status: INITIAL  4.  Patient will demonstrate improved core strength as demonstrated by 20s plank. Baseline: 9s at best Goal status: INITIAL  5.  Patient will tolerate 30 min of standing/walking to perform household chores and ADLs. Baseline: 10 min max Goal status: INITIAL   PLAN:  PT FREQUENCY: 1x/week  PT DURATION: 10 weeks  PLANNED INTERVENTIONS: 97110-Therapeutic exercises, 97530- Therapeutic activity, 97112- Neuromuscular re-education, 97535- Self Care, 02859- Manual therapy, G0283- Electrical stimulation (unattended), [phone]- Traction (mechanical), 20560 (1-2 muscles), 20561 (3+ muscles)- Dry Needling, Patient/Family education, Balance training, Stair training, Taping, Joint mobilization, Joint manipulation, Spinal manipulation, Spinal mobilization,  Cryotherapy, and Moist heat.  PLAN FOR NEXT SESSION: low back and hip stretching, core strengthening    Tanda KANDICE Sorrow, PTA 07/31/2023, 3:15 PM

## 2023-08-07 NOTE — Therapy (Signed)
 OUTPATIENT PHYSICAL THERAPY THORACOLUMBAR TREATMENT   Patient Name: Kevin Holloway MRN: [id number] DOB:12/16/2005, 18 y.o., male Today's Date: 08/08/2023  END OF SESSION:  PT End of Session - 08/08/23 1540     Visit Number 3    Date for PT Re-Evaluation 09/25/23    PT Start Time 1545    PT Stop Time 1630    PT Time Calculation (min) 45 min    Activity Tolerance Patient tolerated treatment well    Behavior During Therapy [facility] for tasks assessed/performed           Past Medical History:  Diagnosis Date   Asthma    Myocarditis (HCC)    History reviewed. No pertinent surgical history. 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   Musculoskeletal back pain 05/13/2023   Eczema 05/13/2023    PCP: Corrina Sabin  REFERRING PROVIDER: Corrina Sabin  REFERRING DIAG:  M54.9 (ICD-10-CM) - Musculoskeletal back pain    Rationale for Evaluation and Treatment: Rehabilitation  THERAPY DIAG:  Chronic bilateral low back pain without sciatica  Other low back pain  Muscle weakness (generalized)  Curvature of spine  ONSET DATE: 3 years   SUBJECTIVE:                                                                                                                                                                                           SUBJECTIVE STATEMENT: Nothing new, same stuff.   If I stand up too long my back starts aching. It has been going on for like 3 years. After 10 mins I have to sit down.   PERTINENT HISTORY:  The patient, with a history of asthma, eczema and myocarditis, is establishing care with the new clinic. The patient has been experiencing back pain for about two years, which worsens with standing or walking for more than eight minutes. The pain, rated as a 5/10, is located in the middle of the lower back and does not radiate down the legs. The patient finds relief by sitting and resting.  The patient's mother also  reports that the patient has been having sleep issues.  He is unable to fall asleep until early hours of the morning stays asleep until 4 into the morning.  Denies caffeine intake or daytime naps.  The patient has a history of chest pain and was seen in the emergency room for this in 02/2023 and myocarditis was ruled out at that time.   For his eczema he uses triamcinolone and for his asthma he is on Ventolin  as needed but not on any maintenance treatment.  He uses cetirizine  and Flonase  for seasonal allergies.  PAIN:  Are you having pain? Yes: NPRS scale: 0/10 Pain location: low back  Pain description: ache type of pain Aggravating factors: standing too long, sitting for too long, walk too much Relieving factors: resting or laying down  PRECAUTIONS: None  RED FLAGS: None   WEIGHT BEARING RESTRICTIONS: No  FALLS:  Has patient fallen in last 6 months? 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  DIAGNOSTIC FINDINGS:  FINDINGS: There is no evidence of lumbar spine fracture. Levocurvature of spine. Intervertebral disc spaces are maintained.   IMPRESSION: No acute fracture or dislocation. Levocurvature of spine.  PATIENT SURVEYS:  Modified Oswestry 13/50 = 26%   COGNITION: Overall cognitive status: Within functional limits for tasks assessed     SENSATION: WFL  MUSCLE LENGTH: Hamstrings: very tight in BLE   POSTURE: No Significant postural limitations  PALPATION: No TTP, some tightness in paraspinals   LUMBAR ROM:   AROM eval  Flexion Distal shin no pain just tight  Extension WFL  Right lateral flexion WFL  Left lateral flexion WFL  Right rotation WFL   Left rotation 75%   (Blank rows = not tested)  LOWER EXTREMITY ROM:  all WFL    LOWER EXTREMITY MMT:  5/5   LUMBAR SPECIAL TESTS:  Straight leg raise test: Positive and FABER test: Negative  FUNCTIONAL TESTS:  5 times sit to stand: 11s    TREATMENT DATE: 08/08/23 NuStep L5x55mins  Shoulder ext 10# 2x10 AR press 10# 2x10 Seated row 35# 2x10 Lat pull down 35# 2x10 blackTB ext 2x10  Leg press 40# 2x10 STS with OHP 2x10  Plank hold 10s x5  Lateral band steps green band    07/31/23 NuStep L 5 x 6 min Sit to stand w/ OHP 2x10 Seated Rows & Lats 35lb 2x10 Shoulder ext 10lb 2x10 Bridges 2x10 LE on Pball bridges, K2C, Oblq Modifies dead bugs 2x5 Bilateral LE stretching to piriformis, HS, K2C, Glute  07/17/23  EVAL,HEP                                                                                                                                 PATIENT EDUCATION:  Education details: POC, HEP, anatomy  Person educated: Patient Education method: Explanation Education comprehension: verbalized understanding and returned demonstration  HOME EXERCISE PROGRAM: Access Code: [email or web address] URL: [email or web address] Date: 07/17/2023 Prepared by: Almetta Fam  Exercises - Supine Lower Trunk Rotation  - 2 x daily - 7 x weekly - 2 sets - 10 reps - Supine Bridge  - 2 x daily - 7 x weekly - 2 sets -  10 reps - Supine Single Knee to Chest Stretch  - 2 x daily - 7 x weekly - 2 sets - 2 reps - 15 hold - Seated Hamstring Stretch  - 1 x daily - 7 x weekly - 2 sets - 2 reps - 15 hold - Standard Plank  - 2 x daily - 7 x weekly - 2 sets - 5 reps - 10 hold  ASSESSMENT:  CLINICAL IMPRESSION: Patient is a 18 y.o. male who was seen today for physical therapy treatment for LBP.  He reports increase back pain with activity and his standing and walking tolerance is only about 10 mins. Continued with some postural core and back strengthening. Cues needed for pacing and to not snatch  with strengthening. Core weakness with plank holds and AR press. Patient will benefit from PT to address his low back pain by increasing core and low back strength to improve stability and increase his tolerance.   OBJECTIVE IMPAIRMENTS: decreased ROM, decreased strength, impaired flexibility, and pain.   ACTIVITY LIMITATIONS: carrying, lifting, bending, squatting, and locomotion level  PARTICIPATION LIMITATIONS: community activity, occupation, and yard work  PERSONAL FACTORS: Age, Behavior pattern, and Time since onset of injury/illness/exacerbation are also affecting patient's functional outcome.   REHAB POTENTIAL: Good  CLINICAL DECISION MAKING: Stable/uncomplicated  EVALUATION COMPLEXITY: Low   GOALS: Goals reviewed with patient? Yes  SHORT TERM GOALS: Target date: 08/21/23  Patient will be independent with initial HEP.  Baseline:  Goal status: MET 08/08/23   LONG TERM GOALS: Target date: 09/25/23  Patient will be independent with advanced/ongoing HEP to improve outcomes and carryover.  Baseline:  Goal status: INITIAL  2.  Patient will report <2/10 pain in low back pain to improve QOL.  Baseline: 5/10 Goal status: INITIAL  3.  Patient will demonstrate full pain free lumbar ROM to perform ADLs.   Baseline: small limitations with flexion and L rotation Goal status: INITIAL  4.  Patient will demonstrate improved core strength as demonstrated by 20s plank. Baseline: 9s at best Goal status: INITIAL  5.  Patient will tolerate 30 min of standing/walking to perform household chores and ADLs. Baseline: 10 min max Goal status: INITIAL   PLAN:  PT FREQUENCY: 1x/week  PT DURATION: 10 weeks  PLANNED INTERVENTIONS: 97110-Therapeutic exercises, 97530- Therapeutic activity, 97112- Neuromuscular re-education, 97535- Self Care, 02859- Manual therapy, G0283- Electrical stimulation (unattended), [phone]- Traction (mechanical), 20560 (1-2 muscles), 20561 (3+ muscles)- Dry Needling,  Patient/Family education, Balance training, Stair training, Taping, Joint mobilization, Joint manipulation, Spinal manipulation, Spinal mobilization, Cryotherapy, and Moist heat.  PLAN FOR NEXT SESSION: low back and hip stretching, core strengthening    Almetta Fam, PT 08/08/2023, 4:29 PM

## 2023-08-08 ENCOUNTER — Ambulatory Visit: Attending: Family Medicine

## 2023-08-08 DIAGNOSIS — M6281 Muscle weakness (generalized): Secondary | ICD-10-CM | POA: Diagnosis present

## 2023-08-08 DIAGNOSIS — M439 Deforming dorsopathy, unspecified: Secondary | ICD-10-CM | POA: Insufficient documentation

## 2023-08-08 DIAGNOSIS — M545 Low back pain, unspecified: Secondary | ICD-10-CM | POA: Insufficient documentation

## 2023-08-08 DIAGNOSIS — M5459 Other low back pain: Secondary | ICD-10-CM | POA: Insufficient documentation

## 2023-08-08 DIAGNOSIS — G8929 Other chronic pain: Secondary | ICD-10-CM | POA: Diagnosis present

## 2023-08-15 ENCOUNTER — Ambulatory Visit: Admitting: Physical Therapy

## 2023-08-15 ENCOUNTER — Encounter: Payer: Self-pay | Admitting: Physical Therapy

## 2023-08-15 DIAGNOSIS — M5459 Other low back pain: Secondary | ICD-10-CM

## 2023-08-15 DIAGNOSIS — M545 Low back pain, unspecified: Secondary | ICD-10-CM

## 2023-08-15 DIAGNOSIS — M6281 Muscle weakness (generalized): Secondary | ICD-10-CM

## 2023-08-15 NOTE — Therapy (Signed)
 OUTPATIENT PHYSICAL THERAPY THORACOLUMBAR TREATMENT   Patient Name: Kevin Holloway MRN: [id number] DOB:[birth year], 18 y.o., male Today's Date: 08/15/2023  END OF SESSION:  PT End of Session - 08/15/23 1534     Visit Number 4    Date for PT Re-Evaluation 09/25/23    PT Start Time 1535    PT Stop Time 1620    PT Time Calculation (min) 45 min    Activity Tolerance Patient tolerated treatment well    Behavior During Therapy [facility] for tasks assessed/performed           Past Medical History:  Diagnosis Date   Asthma    Myocarditis (HCC)    History reviewed. No pertinent surgical history. 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   Musculoskeletal back pain 05/13/2023   Eczema 05/13/2023    PCP: Corrina Sabin  REFERRING PROVIDER: Corrina Sabin  REFERRING DIAG:  M54.9 (ICD-10-CM) - Musculoskeletal back pain    Rationale for Evaluation and Treatment: Rehabilitation  THERAPY DIAG:  Chronic bilateral low back pain without sciatica  Other low back pain  Muscle weakness (generalized)  ONSET DATE: 3 years   SUBJECTIVE:                                                                                                                                                                                           SUBJECTIVE STATEMENT: Good lately  Been trying not to stand or sit a lot  If I stand up too long my back starts aching. It has been going on for like 3 years. After 10 mins I have to sit down.   PERTINENT HISTORY:  The patient, with a history of asthma, eczema and myocarditis, is establishing care with the new clinic. The patient has been experiencing back pain for about two years, which worsens with standing or walking for more than eight minutes. The pain, rated as a 5/10, is located in the middle of the lower back and does not radiate down the legs. The patient finds relief by sitting and resting.  The patient's  mother also reports that the patient has been having sleep issues.  He is unable to fall asleep until early hours of the morning stays asleep until 4 into the morning.  Denies caffeine intake or daytime naps.  The patient has a history of chest pain and was seen in the emergency room for this in 02/2023 and myocarditis was ruled out at that time.   For his eczema he uses triamcinolone and for his asthma he is on Ventolin  as needed but not on any  maintenance treatment.  He uses cetirizine  and Flonase  for seasonal allergies.  PAIN:  Are you having pain? Yes: NPRS scale: 0/10 Pain location: low back  Pain description: ache type of pain Aggravating factors: standing too long, sitting for too long, walk too much Relieving factors: resting or laying down  PRECAUTIONS: None  RED FLAGS: None   WEIGHT BEARING RESTRICTIONS: No  FALLS:  Has patient fallen in last 6 months? 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  DIAGNOSTIC FINDINGS:  FINDINGS: There is no evidence of lumbar spine fracture. Levocurvature of spine. Intervertebral disc spaces are maintained.   IMPRESSION: No acute fracture or dislocation. Levocurvature of spine.  PATIENT SURVEYS:  Modified Oswestry 13/50 = 26%   COGNITION: Overall cognitive status: Within functional limits for tasks assessed     SENSATION: WFL  MUSCLE LENGTH: Hamstrings: very tight in BLE   POSTURE: No Significant postural limitations  PALPATION: No TTP, some tightness in paraspinals   LUMBAR ROM:   AROM eval 08/15/23  Flexion Distal shin no pain just tight WFL  Extension [facility] WFL  Right lateral flexion [facility] WFL  Left lateral  flexion WFL WFL  Right rotation [facility] WFL  Left rotation 75% WFL   (Blank rows = not tested)  LOWER EXTREMITY ROM:  all WFL    LOWER EXTREMITY MMT:  5/5   LUMBAR SPECIAL TESTS:  Straight leg raise test: Positive and FABER test: Negative  FUNCTIONAL TESTS:  5 times sit to stand: 11s    TREATMENT DATE: 08/15/23 NuStep L 5 x 6 min GOALS Rows & Lats 35lb 2x10 Shoulder Ext 10lb 2x10 AR press 20lb x10 each  HS curls 35lb 2x10  Leg Ext 10lb 2x10 Sit to stand OHP blue ball 2x10 Leg press 60lb 2x10   08/08/23 NuStep L5x25mins  Shoulder ext 10# 2x10 AR press 10# 2x10 Seated row 35# 2x10 Lat pull down 35# 2x10 blackTB ext 2x10  Leg press 40# 2x10 STS with OHP 2x10  Plank hold 10s x5  Lateral band steps green band    07/31/23 NuStep L 5 x 6 min Sit to stand w/ OHP 2x10 Seated Rows & Lats 35lb 2x10 Shoulder ext 10lb 2x10 Bridges 2x10 LE on Pball bridges, K2C, Oblq Modifies dead bugs 2x5 Bilateral LE stretching to piriformis, HS, K2C, Glute  07/17/23  EVAL,HEP                                                                                                                                 PATIENT EDUCATION:  Education details: POC, HEP, anatomy  Person educated: Patient Education method: Explanation Education comprehension: verbalized understanding and  returned demonstration  HOME EXERCISE PROGRAM: Access Code: [email or web address] URL: [email or web address] Date: 07/17/2023 Prepared by: Almetta Fam  Exercises - Supine Lower Trunk Rotation  - 2 x daily - 7 x weekly - 2 sets - 10 reps - Supine Bridge  - 2 x daily - 7 x weekly - 2 sets - 10 reps - Supine Single Knee to Chest Stretch  - 2 x daily - 7 x weekly - 2 sets - 2 reps - 15 hold - Seated Hamstring Stretch  - 1 x daily - 7 x weekly - 2 sets - 2 reps - 15 hold - Standard Plank  - 2 x daily - 7 x weekly - 2 sets - 5 reps - 10 hold  ASSESSMENT:  CLINICAL IMPRESSION: Patient is a 18 y.o. male who was seen today  for physical therapy treatment for LBP. He has. He has progressed increasing his lumbar AROM. Session consisted of postural strengthening. Core weakness with  anti rotational press. No pain reported during session. Patient will benefit from PT to address his low back pain by increasing core and low back strength to improve stability and increase his tolerance.   OBJECTIVE IMPAIRMENTS: decreased ROM, decreased strength, impaired flexibility, and pain.   ACTIVITY LIMITATIONS: carrying, lifting, bending, squatting, and locomotion level  PARTICIPATION LIMITATIONS: community activity, occupation, and yard work  PERSONAL FACTORS: Age, Behavior pattern, and Time since onset of injury/illness/exacerbation are also affecting patient's functional outcome.   REHAB POTENTIAL: Good  CLINICAL DECISION MAKING: Stable/uncomplicated  EVALUATION COMPLEXITY: Low   GOALS: Goals reviewed with patient? Yes  SHORT TERM GOALS: Target date: 08/21/23  Patient will be independent with initial HEP.  Baseline:  Goal status: MET 08/08/23   LONG TERM GOALS: Target date: 09/25/23  Patient will be independent with advanced/ongoing HEP to improve outcomes and carryover.  Baseline:  Goal status: INITIAL  2.  Patient will report <2/10 pain in low back pain to improve QOL.  Baseline: 5/10 Goal status: Progressing 3/10 08/15/23  3.  Patient will demonstrate full pain free lumbar ROM to perform ADLs.   Baseline: small limitations with flexion and L rotation Goal status: Met 08/15/23  4.  Patient will demonstrate improved core strength as demonstrated by 20s plank. Baseline: 9s at best Goal status: INITIAL  5.  Patient will tolerate 30 min of standing/walking to perform household chores and ADLs. Baseline: 10 min max Goal status: Progressing 08/15/23   PLAN:  PT FREQUENCY: 1x/week  PT DURATION: 10 weeks  PLANNED INTERVENTIONS: 97110-Therapeutic exercises, 97530- Therapeutic activity, 97112- Neuromuscular  re-education, 97535- Self Care, 02859- Manual therapy, [id number]- Electrical stimulation (unattended), [phone]- Traction (mechanical), [phone] (1-2 muscles), [id number] (3+ muscles)- Dry Needling, Patient/Family education, Balance training, Stair training, Taping, Joint mobilization, Joint manipulation, Spinal manipulation, Spinal mobilization, Cryotherapy, and Moist heat.  PLAN FOR NEXT SESSION: low back and hip stretching, core strengthening    Tanda KANDICE Sorrow, PTA 08/15/2023, 3:35 PM

## 2023-08-21 NOTE — Therapy (Signed)
 OUTPATIENT PHYSICAL THERAPY THORACOLUMBAR TREATMENT   Patient Name: Kevin Holloway MRN: [id number] DOB:April 03, 2005, 18 y.o., male Today's Date: 08/22/2023  END OF SESSION:  PT End of Session - 08/22/23 1543     Visit Number 5    Date for PT Re-Evaluation 09/25/23    PT Start Time 1545    PT Stop Time 1630    PT Time Calculation (min) 45 min    Activity Tolerance Patient tolerated treatment well    Behavior During Therapy [facility] for tasks assessed/performed            Past Medical History:  Diagnosis Date   Asthma    Myocarditis (HCC)    History reviewed. No pertinent surgical history. 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   Musculoskeletal back pain 05/13/2023   Eczema 05/13/2023    PCP: Corrina Sabin  REFERRING PROVIDER: Corrina Sabin  REFERRING DIAG:  M54.9 (ICD-10-CM) - Musculoskeletal back pain    Rationale for Evaluation and Treatment: Rehabilitation  THERAPY DIAG:  Chronic bilateral low back pain without sciatica  Other low back pain  Muscle weakness (generalized)  Curvature of spine  ONSET DATE: 3 years   SUBJECTIVE:                                                                                                                                                                                           SUBJECTIVE STATEMENT: Back is feeling a little better, still a little bit of pain.   If I stand up too long my back starts aching. It has been going on for like 3 years. After 10 mins I have to sit down.   PERTINENT HISTORY:  The patient, with a history of asthma, eczema and myocarditis, is establishing care with the new clinic. The patient has been experiencing back pain for about two years, which worsens with standing or walking for more than eight minutes. The pain, rated as a 5/10, is located in the middle of the lower back and does not radiate down the legs. The patient finds relief by sitting and  resting.  The patient's mother also reports that the patient has been having sleep issues.  He is unable to fall asleep until early hours of the morning stays asleep until 4 into the morning.  Denies caffeine intake or daytime naps.  The patient has a history of chest pain and was seen in the emergency room for this in 02/2023 and myocarditis was ruled out at that time.   For his eczema he uses triamcinolone and for his asthma he is on Ventolin   as needed but not on any maintenance treatment.  He uses cetirizine  and Flonase  for seasonal allergies.  PAIN:  Are you having pain? Yes: NPRS scale: 0/10 Pain location: low back  Pain description: ache type of pain Aggravating factors: standing too long, sitting for too long, walk too much Relieving factors: resting or laying down  PRECAUTIONS: None  RED FLAGS: None   WEIGHT BEARING RESTRICTIONS: No  FALLS:  Has patient fallen in last 6 months? 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  DIAGNOSTIC FINDINGS:  FINDINGS: There is no evidence of lumbar spine fracture. Levocurvature of spine. Intervertebral disc spaces are maintained.   IMPRESSION: No acute fracture or dislocation. Levocurvature of spine.  PATIENT SURVEYS:  Modified Oswestry 13/50 = 26%   COGNITION: Overall cognitive status: Within functional limits for tasks assessed     SENSATION: WFL  MUSCLE LENGTH: Hamstrings: very tight in BLE   POSTURE: No Significant postural limitations  PALPATION: No TTP, some tightness in paraspinals   LUMBAR ROM:   AROM eval 08/15/23  Flexion Distal shin no pain just tight WFL  Extension [facility] WFL  Right lateral flexion [facility]  WFL  Left lateral flexion WFL WFL  Right rotation [facility] WFL  Left rotation 75% WFL   (Blank rows = not tested)  LOWER EXTREMITY ROM:  all WFL    LOWER EXTREMITY MMT:  5/5   LUMBAR SPECIAL TESTS:  Straight leg raise test: Positive and FABER test: Negative  FUNCTIONAL TESTS:  5 times sit to stand: 11s    TREATMENT DATE: 08/22/23 NuStep L5x43mins  Lateral flexion with 10# 2x10 Holding 10# marching both sides 2x10 Rows and Lats 35# 2x10 blackTB ext 2x10 blackTB flexion 2x10 Birddogs 2x10 alternating  Deadbugs 2x10 alternating  HS curls 35lb 3x10  Leg Ext 10lb 3x10 STS with OHP 5# dumbbells 2x10  08/15/23 NuStep L 5 x 6 min GOALS Rows & Lats 35lb 2x10 Shoulder Ext 10lb 2x10 AR press 20lb x10 each  HS curls 35lb 2x10  Leg Ext 10lb 2x10 Sit to stand OHP blue ball 2x10 Leg press 60lb 2x10   08/08/23 NuStep L5x31mins  Shoulder ext 10# 2x10 AR press 10# 2x10 Seated row 35# 2x10 Lat pull down 35# 2x10 blackTB ext 2x10  Leg press 40# 2x10 STS with OHP 2x10  Plank hold 10s x5  Lateral band steps green band    07/31/23 NuStep L 5 x 6 min Sit to stand w/ OHP 2x10 Seated Rows & Lats 35lb 2x10 Shoulder ext 10lb 2x10 Bridges 2x10 LE on Pball bridges, K2C, Oblq Modifies dead bugs 2x5 Bilateral LE stretching to piriformis, HS, K2C, Glute  07/17/23  EVAL,HEP  PATIENT EDUCATION:  Education details: POC, HEP, anatomy  Person educated: Patient Education method: Explanation Education comprehension: verbalized understanding and returned demonstration  HOME EXERCISE PROGRAM: Access Code: [email or web address] URL: [email or web address] Date: 07/17/2023 Prepared by: Almetta Fam  Exercises - Supine Lower Trunk Rotation  - 2 x daily - 7 x weekly - 2 sets - 10 reps - Supine Bridge  - 2 x daily - 7 x weekly - 2 sets - 10 reps - Supine Single Knee  to Chest Stretch  - 2 x daily - 7 x weekly - 2 sets - 2 reps - 15 hold - Seated Hamstring Stretch  - 1 x daily - 7 x weekly - 2 sets - 2 reps - 15 hold - Standard Plank  - 2 x daily - 7 x weekly - 2 sets - 5 reps - 10 hold  ASSESSMENT:  CLINICAL IMPRESSION: Patient is a 18 y.o. male who was seen today for physical therapy treatment for LBP. He reports it has improved some since starting PT. Session consisted of mostly core strengthening today. No pain reported during session. Difficulty with birddogs and deadbugs. He feels that the strengthening in his legs may be helping. Patient will benefit from PT to address his low back pain by increasing core and low back strength to improve stability and increase his tolerance.   OBJECTIVE IMPAIRMENTS: decreased ROM, decreased strength, impaired flexibility, and pain.   ACTIVITY LIMITATIONS: carrying, lifting, bending, squatting, and locomotion level  PARTICIPATION LIMITATIONS: community activity, occupation, and yard work  PERSONAL FACTORS: Age, Behavior pattern, and Time since onset of injury/illness/exacerbation are also affecting patient's functional outcome.   REHAB POTENTIAL: Good  CLINICAL DECISION MAKING: Stable/uncomplicated  EVALUATION COMPLEXITY: Low   GOALS: Goals reviewed with patient? Yes  SHORT TERM GOALS: Target date: 08/21/23  Patient will be independent with initial HEP.  Baseline:  Goal status: MET 08/08/23   LONG TERM GOALS: Target date: 09/25/23  Patient will be independent with advanced/ongoing HEP to improve outcomes and carryover.  Baseline:  Goal status: INITIAL  2.  Patient will report <2/10 pain in low back pain to improve QOL.  Baseline: 5/10 Goal status: Progressing 3/10 08/15/23  3.  Patient will demonstrate full pain free lumbar ROM to perform ADLs.   Baseline: small limitations with flexion and L rotation Goal status: Met 08/15/23  4.  Patient will demonstrate improved core strength as demonstrated by  20s plank. Baseline: 9s at best Goal status: INITIAL  5.  Patient will tolerate 30 min of standing/walking to perform household chores and ADLs. Baseline: 10 min max Goal status: Progressing 08/15/23   PLAN:  PT FREQUENCY: 1x/week  PT DURATION: 10 weeks  PLANNED INTERVENTIONS: 97110-Therapeutic exercises, 97530- Therapeutic activity, 97112- Neuromuscular re-education, 97535- Self Care, 02859- Manual therapy, [id number]- Electrical stimulation (unattended), [phone]- Traction (mechanical), [phone] (1-2 muscles), 20561 (3+ muscles)- Dry Needling, Patient/Family education, Balance training, Stair training, Taping, Joint mobilization, Joint manipulation, Spinal manipulation, Spinal mobilization, Cryotherapy, and Moist heat.  PLAN FOR NEXT SESSION: low back and hip stretching, core strengthening    Almetta Fam, PT 08/22/2023, 4:27 PM

## 2023-08-22 ENCOUNTER — Ambulatory Visit

## 2023-08-22 DIAGNOSIS — M5459 Other low back pain: Secondary | ICD-10-CM

## 2023-08-22 DIAGNOSIS — M545 Low back pain, unspecified: Secondary | ICD-10-CM

## 2023-08-22 DIAGNOSIS — M439 Deforming dorsopathy, unspecified: Secondary | ICD-10-CM

## 2023-08-22 DIAGNOSIS — M6281 Muscle weakness (generalized): Secondary | ICD-10-CM

## 2023-08-28 ENCOUNTER — Ambulatory Visit: Admitting: Physical Therapy

## 2023-08-28 ENCOUNTER — Encounter: Payer: Self-pay | Admitting: Physical Therapy

## 2023-08-28 DIAGNOSIS — M545 Low back pain, unspecified: Secondary | ICD-10-CM | POA: Diagnosis not present

## 2023-08-28 DIAGNOSIS — M439 Deforming dorsopathy, unspecified: Secondary | ICD-10-CM

## 2023-08-28 DIAGNOSIS — M5459 Other low back pain: Secondary | ICD-10-CM

## 2023-08-28 DIAGNOSIS — G8929 Other chronic pain: Secondary | ICD-10-CM

## 2023-08-28 DIAGNOSIS — M6281 Muscle weakness (generalized): Secondary | ICD-10-CM

## 2023-08-28 NOTE — Therapy (Signed)
 OUTPATIENT PHYSICAL THERAPY THORACOLUMBAR TREATMENT   Patient Name: IZEAR PINE MRN: [id number] DOB:[birth year], 18 y.o., male Today's Date: 08/28/2023  END OF SESSION:  PT End of Session - 08/28/23 0844     Visit Number 6    Date for PT Re-Evaluation 09/25/23    PT Start Time 0845    PT Stop Time 0930    PT Time Calculation (min) 45 min    Activity Tolerance Patient tolerated treatment well    Behavior During Therapy [facility] for tasks assessed/performed            Past Medical History:  Diagnosis Date   Asthma    Myocarditis (HCC)    History reviewed. No pertinent surgical history. 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   Musculoskeletal back pain 05/13/2023   Eczema 05/13/2023    PCP: Corrina Sabin  REFERRING PROVIDER: Corrina Sabin  REFERRING DIAG:  M54.9 (ICD-10-CM) - Musculoskeletal back pain    Rationale for Evaluation and Treatment: Rehabilitation  THERAPY DIAG:  Chronic bilateral low back pain without sciatica  Other low back pain  Muscle weakness (generalized)  Curvature of spine  ONSET DATE: 3 years   SUBJECTIVE:                                                                                                                                                                                           SUBJECTIVE STATEMENT: Good Still having the pain a little bit after prolong standing and sitting  If I stand up too long my back starts aching. It has been going on for like 3 years. After 10 mins I have to sit down.   PERTINENT HISTORY:  The patient, with a history of asthma, eczema and myocarditis, is establishing care with the new clinic. The patient has been experiencing back pain for about two years, which worsens with standing or walking for more than eight minutes. The pain, rated as a 5/10, is located in the middle of the lower back and does not radiate down the legs. The patient finds relief  by sitting and resting.  The patient's mother also reports that the patient has been having sleep issues.  He is unable to fall asleep until early hours of the morning stays asleep until 4 into the morning.  Denies caffeine intake or daytime naps.  The patient has a history of chest pain and was seen in the emergency room for this in 02/2023 and myocarditis was ruled out at that time.   For his eczema he uses triamcinolone and for his asthma he is on Ventolin   as needed but not on any maintenance treatment.  He uses cetirizine  and Flonase  for seasonal allergies.  PAIN:  Are you having pain? Yes: NPRS scale: 0/10 Pain location: low back  Pain description: ache type of pain Aggravating factors: standing too long, sitting for too long, walk too much Relieving factors: resting or laying down  PRECAUTIONS: None  RED FLAGS: None   WEIGHT BEARING RESTRICTIONS: No  FALLS:  Has patient fallen in last 6 months? 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  DIAGNOSTIC FINDINGS:  FINDINGS: There is no evidence of lumbar spine fracture. Levocurvature of spine. Intervertebral disc spaces are maintained.   IMPRESSION: No acute fracture or dislocation. Levocurvature of spine.  PATIENT SURVEYS:  Modified Oswestry 13/50 = 26%   COGNITION: Overall cognitive status: Within functional limits for tasks assessed     SENSATION: WFL  MUSCLE LENGTH: Hamstrings: very tight in BLE   POSTURE: No Significant postural limitations  PALPATION: No TTP, some tightness in paraspinals   LUMBAR ROM:   AROM eval 08/15/23  Flexion Distal shin no pain just tight WFL  Extension [facility] WFL  Right  lateral flexion [facility] WFL  Left lateral flexion WFL WFL  Right rotation [facility] WFL  Left rotation 75% WFL   (Blank rows = not tested)  LOWER EXTREMITY ROM:  all WFL    LOWER EXTREMITY MMT:  5/5   LUMBAR SPECIAL TESTS:  Straight leg raise test: Positive and FABER test: Negative  FUNCTIONAL TESTS:  5 times sit to stand: 11s    TREATMENT DATE: 08/28/23 NuStep L5x 7 mins  Rows & Lats 45lb 2x10 Leg press 80lb 2x15 5 ten sec planks   20 sec but shaky Bridges x10 LE on Pball bridges, K2C, Oblq Pball crunches 2x15 Shoulder Ext 10lb 2x12 Sit to stand OHP blue ball 2x12 Standing military press 2x10 8lb    08/22/23 NuStep L5x35mins  Lateral flexion with 10# 2x10 Holding 10# marching both sides 2x10 Rows and Lats 35# 2x10 blackTB ext 2x10 blackTB flexion 2x10 Birddogs 2x10 alternating  Deadbugs 2x10 alternating  HS curls 35lb 3x10  Leg Ext 10lb 3x10 STS with OHP 5# dumbbells 2x10  08/15/23 NuStep L 5 x 6 min GOALS Rows & Lats 35lb 2x10 Shoulder Ext 10lb 2x10 AR press 20lb x10 each  HS curls 35lb 2x10  Leg Ext 10lb 2x10 Sit to stand OHP blue ball 2x10 Leg press 60lb 2x10   08/08/23 NuStep L5x33mins  Shoulder ext 10# 2x10 AR press 10# 2x10 Seated row 35# 2x10 Lat pull down 35# 2x10 blackTB ext 2x10  Leg press 40# 2x10 STS with OHP 2x10  Plank hold 10s x5  Lateral band steps green band    07/31/23 NuStep L 5 x 6 min Sit to stand w/ OHP 2x10 Seated Rows & Lats 35lb 2x10 Shoulder ext 10lb 2x10 Bridges 2x10 LE on Pball bridges, K2C, Oblq Modifies dead bugs 2x5 Bilateral LE stretching to piriformis, HS, K2C, Glute  07/17/23  EVAL,HEP  PATIENT EDUCATION:  Education details: POC, HEP, anatomy  Person educated: Patient Education method: Explanation Education comprehension: verbalized understanding and returned demonstration  HOME  EXERCISE PROGRAM: Access Code: [email or web address] URL: [email or web address] Date: 07/17/2023 Prepared by: Almetta Fam  Exercises - Supine Lower Trunk Rotation  - 2 x daily - 7 x weekly - 2 sets - 10 reps - Supine Bridge  - 2 x daily - 7 x weekly - 2 sets - 10 reps - Supine Single Knee to Chest Stretch  - 2 x daily - 7 x weekly - 2 sets - 2 reps - 15 hold - Seated Hamstring Stretch  - 1 x daily - 7 x weekly - 2 sets - 2 reps - 15 hold - Standard Plank  - 2 x daily - 7 x weekly - 2 sets - 5 reps - 10 hold  ASSESSMENT:  CLINICAL IMPRESSION: Patient is a 18 y.o. male who was seen today for physical therapy treatment for LBP. Session consisted of mostly core strengthening today. He has made some improvement towards plank goal, but core remains weak. No pain reported during session. Increase resistance tolerated on leg press. Visual shaking with [employer] presses. Patient will benefit from PT to address his low back pain by increasing core and low back strength to improve stability and increase his tolerance.   OBJECTIVE IMPAIRMENTS: decreased ROM, decreased strength, impaired flexibility, and pain.   ACTIVITY LIMITATIONS: carrying, lifting, bending, squatting, and locomotion level  PARTICIPATION LIMITATIONS: community activity, occupation, and yard work  PERSONAL FACTORS: Age, Behavior pattern, and Time since onset of injury/illness/exacerbation are also affecting patient's functional outcome.   REHAB POTENTIAL: Good  CLINICAL DECISION MAKING: Stable/uncomplicated  EVALUATION COMPLEXITY: Low   GOALS: Goals reviewed with patient? Yes  SHORT TERM GOALS: Target date: 08/21/23  Patient will be independent with initial HEP.  Baseline:  Goal status: MET 08/08/23   LONG TERM GOALS: Target date: 09/25/23  Patient will be independent with advanced/ongoing HEP to improve outcomes and carryover.  Baseline:  Goal status: INITIAL  2.  Patient will report <2/10 pain in low back pain to  improve QOL.  Baseline: 5/10 Goal status: Progressing 3/10 08/15/23  3.  Patient will demonstrate full pain free lumbar ROM to perform ADLs.   Baseline: small limitations with flexion and L rotation Goal status: Met 08/15/23  4.  Patient will demonstrate improved core strength as demonstrated by 20s plank. Baseline: 9s at best Goal status: Progressing 08/07/23  5.  Patient will tolerate 30 min of standing/walking to perform household chores and ADLs. Baseline: 10 min max Goal status: Progressing 08/15/23, ongoing 08/28/23 8 min   PLAN:  PT FREQUENCY: 1x/week  PT DURATION: 10 weeks  PLANNED INTERVENTIONS: 97110-Therapeutic exercises, 97530- Therapeutic activity, 97112- Neuromuscular re-education, 97535- Self Care, [id number]- Manual therapy, [id number]- Electrical stimulation (unattended), [phone]- Traction (mechanical), 20560 (1-2 muscles), 20561 (3+ muscles)- Dry Needling, Patient/Family education, Balance training, Stair training, Taping, Joint mobilization, Joint manipulation, Spinal manipulation, Spinal mobilization, Cryotherapy, and Moist heat.  PLAN FOR NEXT SESSION: low back and hip stretching, core strengthening    Tanda KANDICE Sorrow, PTA 08/28/2023, 8:45 AM

## 2023-09-06 ENCOUNTER — Ambulatory Visit: Attending: Family Medicine | Admitting: Physical Therapy

## 2023-09-06 ENCOUNTER — Encounter: Payer: Self-pay | Admitting: Physical Therapy

## 2023-09-06 DIAGNOSIS — M545 Low back pain, unspecified: Secondary | ICD-10-CM | POA: Insufficient documentation

## 2023-09-06 DIAGNOSIS — M6281 Muscle weakness (generalized): Secondary | ICD-10-CM | POA: Diagnosis present

## 2023-09-06 DIAGNOSIS — G8929 Other chronic pain: Secondary | ICD-10-CM | POA: Insufficient documentation

## 2023-09-06 DIAGNOSIS — M439 Deforming dorsopathy, unspecified: Secondary | ICD-10-CM | POA: Diagnosis present

## 2023-09-06 DIAGNOSIS — M5459 Other low back pain: Secondary | ICD-10-CM | POA: Insufficient documentation

## 2023-09-06 NOTE — Therapy (Signed)
 OUTPATIENT PHYSICAL THERAPY THORACOLUMBAR TREATMENT   Patient Name: Kevin Holloway MRN: [id number] DOB:[birth year], 18 y.o., male Today's Date: 09/06/2023  END OF SESSION:  PT End of Session - 09/06/23 0753     Visit Number 7    Date for PT Re-Evaluation 09/25/23    PT Start Time 0751    PT Stop Time 0836    PT Time Calculation (min) 45 min    Activity Tolerance Patient tolerated treatment well    Behavior During Therapy [facility] for tasks assessed/performed            Past Medical History:  Diagnosis Date   Asthma    Myocarditis (HCC)    History reviewed. No pertinent surgical history. 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   Musculoskeletal back pain 05/13/2023   Eczema 05/13/2023    PCP: Corrina Sabin  REFERRING PROVIDER: Corrina Sabin  REFERRING DIAG:  M54.9 (ICD-10-CM) - Musculoskeletal back pain    Rationale for Evaluation and Treatment: Rehabilitation  THERAPY DIAG:  Chronic bilateral low back pain without sciatica  Other low back pain  Muscle weakness (generalized)  Curvature of spine  ONSET DATE: 3 years   SUBJECTIVE:                                                                                                                                                                                           SUBJECTIVE STATEMENT: Mostly everything is way better than a month ago  If I stand up too long my back starts aching. It has been going on for like 3 years. After 10 mins I have to sit down.   PERTINENT HISTORY:  The patient, with a history of asthma, eczema and myocarditis, is establishing care with the new clinic. The patient has been experiencing back pain for about two years, which worsens with standing or walking for more than eight minutes. The pain, rated as a 5/10, is located in the middle of the lower back and does not radiate down the legs. The patient finds relief by sitting and resting.   The patient's mother also reports that the patient has been having sleep issues.  He is unable to fall asleep until early hours of the morning stays asleep until 4 into the morning.  Denies caffeine intake or daytime naps.  The patient has a history of chest pain and was seen in the emergency room for this in 02/2023 and myocarditis was ruled out at that time.   For his eczema he uses triamcinolone and for his asthma he is on Ventolin  as needed but not  on any maintenance treatment.  He uses cetirizine  and Flonase  for seasonal allergies.  PAIN:  Are you having pain? Yes: NPRS scale: 0/10 Pain location: low back  Pain description: ache type of pain Aggravating factors: standing too long, sitting for too long, walk too much Relieving factors: resting or laying down  PRECAUTIONS: None  RED FLAGS: None   WEIGHT BEARING RESTRICTIONS: No  FALLS:  Has patient fallen in last 6 months? 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  DIAGNOSTIC FINDINGS:  FINDINGS: There is no evidence of lumbar spine fracture. Levocurvature of spine. Intervertebral disc spaces are maintained.   IMPRESSION: No acute fracture or dislocation. Levocurvature of spine.  PATIENT SURVEYS:  Modified Oswestry 13/50 = 26%   COGNITION: Overall cognitive status: Within functional limits for tasks assessed     SENSATION: WFL  MUSCLE LENGTH: Hamstrings: very tight in BLE   POSTURE: No Significant postural limitations  PALPATION: No TTP, some tightness in paraspinals   LUMBAR ROM:   AROM eval 08/15/23  Flexion Distal shin no pain just tight WFL  Extension [facility] WFL  Right lateral flexion [facility] WFL   Left lateral flexion WFL WFL  Right rotation [facility] WFL  Left rotation 75% WFL   (Blank rows = not tested)  LOWER EXTREMITY ROM:  all WFL    LOWER EXTREMITY MMT:  5/5   LUMBAR SPECIAL TESTS:  Straight leg raise test: Positive and FABER test: Negative  FUNCTIONAL TESTS:  5 times sit to stand: 11s    TREATMENT DATE: 09/06/23 NuStep L5x 7 mins  S2S OHP 10lb dumbbell 2x10 15 sec planks x4 Rows & Lats 45lb 2x10 Leg press 100lb 2x12 Shoulder Ext 10lb 2x12 Standing military press 3x10 8lb HS curls 45lb 2x15 Leg Ext 15lb 2x10 Bridges 2x10   08/28/23 NuStep L5x 7 mins  Rows & Lats 45lb 2x10 Leg press 80lb 2x15 5 ten sec planks   20 sec but shaky Bridges x10 LE on Pball bridges, K2C, Oblq Pball crunches 2x15 Shoulder Ext 10lb 2x12 Sit to stand OHP blue ball 2x12 Standing military press 2x10 8lb    08/22/23 NuStep L5x6mins  Lateral flexion with 10# 2x10 Holding 10# marching both sides 2x10 Rows and Lats 35# 2x10 blackTB ext 2x10 blackTB flexion 2x10 Birddogs 2x10 alternating  Deadbugs 2x10 alternating  HS curls 35lb 3x10  Leg Ext 10lb 3x10 STS with OHP 5# dumbbells 2x10  08/15/23 NuStep L 5 x 6 min GOALS Rows & Lats 35lb 2x10 Shoulder Ext 10lb 2x10 AR press 20lb x10 each  HS curls 35lb 2x10  Leg Ext 10lb 2x10 Sit to stand OHP blue ball 2x10 Leg press 60lb 2x10   08/08/23 NuStep L5x8mins  Shoulder ext 10# 2x10 AR press 10# 2x10 Seated row 35# 2x10 Lat pull down 35# 2x10 blackTB ext 2x10  Leg press 40# 2x10 STS with OHP 2x10  Plank hold 10s x5  Lateral band steps green band    07/31/23 NuStep L 5 x 6 min Sit to stand w/ OHP 2x10 Seated Rows & Lats 35lb 2x10 Shoulder ext 10lb 2x10 Bridges 2x10 LE on Pball bridges, K2C, Oblq Modifies dead bugs 2x5 Bilateral LE stretching to piriformis, HS, K2C, Glute  07/17/23  EVAL,HEP                                                                                                                                  PATIENT EDUCATION:  Education details: POC, HEP, anatomy  Person educated: Patient Education method: Explanation Education comprehension: verbalized understanding and returned demonstration  HOME EXERCISE PROGRAM: Access Code: [email or web address] URL: [email or web address] Date: 07/17/2023 Prepared by: Almetta Fam  Exercises - Supine Lower Trunk Rotation  - 2 x daily - 7 x weekly - 2 sets - 10 reps - Supine Bridge  - 2 x daily - 7 x weekly - 2 sets - 10 reps - Supine Single Knee to Chest Stretch  - 2 x daily - 7 x weekly - 2 sets - 2 reps - 15 hold - Seated Hamstring Stretch  - 1 x daily - 7 x weekly - 2 sets - 2 reps - 15 hold - Standard Plank  - 2 x daily - 7 x weekly - 2 sets - 5 reps - 10 hold  ASSESSMENT:  CLINICAL IMPRESSION: Patient is a 18 y.o. male who was seen today for physical therapy treatment for LBP. He repots improvement overall with less pain.  Again session consisted of mostly core strengthening today. No pain reported during session. Increase resistance tolerated on leg press. Visual shaking with [employer] press remains. Patient will benefit from PT to address his low back pain by increasing core and low back strength to improve stability and increase his tolerance.   OBJECTIVE IMPAIRMENTS: decreased ROM, decreased strength, impaired flexibility, and pain.   ACTIVITY LIMITATIONS: carrying, lifting, bending, squatting, and locomotion level  PARTICIPATION LIMITATIONS: community activity, occupation, and yard work  PERSONAL FACTORS: Age, Behavior pattern, and Time since onset of injury/illness/exacerbation are also affecting patient's functional outcome.   REHAB POTENTIAL: Good  CLINICAL DECISION MAKING: Stable/uncomplicated  EVALUATION COMPLEXITY: Low   GOALS: Goals reviewed with patient? Yes  SHORT TERM GOALS: Target date: 08/21/23  Patient will be independent with initial HEP.  Baseline:  Goal status: MET 08/08/23   LONG TERM GOALS: Target  date: 09/25/23  Patient will be independent with advanced/ongoing HEP to improve outcomes and carryover.  Baseline:  Goal status: INITIAL  2.  Patient will report <2/10 pain in low back pain to improve QOL.  Baseline: 5/10 Goal status: Progressing 3/10 08/15/23, Ongoing 4/10 09/06/23  3.  Patient will demonstrate full pain free lumbar ROM to perform ADLs.   Baseline: small limitations with flexion and L rotation Goal status: Met 08/15/23  4.  Patient will demonstrate improved core strength as demonstrated by 20s plank. Baseline: 9s at best Goal status: Progressing 08/07/23  5.  Patient will tolerate 30 min of standing/walking to perform household chores and ADLs. Baseline: 10 min max Goal status: Progressing 08/15/23, ongoing 08/28/23 8 min   PLAN:  PT FREQUENCY: 1x/week  PT DURATION: 10 weeks  PLANNED INTERVENTIONS: 97110-Therapeutic exercises, 97530-  Therapeutic activity, [id number]- Neuromuscular re-education, [phone]- Self Care, 02859- Manual therapy, G0283- Electrical stimulation (unattended), [phone]- Traction (mechanical), [phone] (1-2 muscles), [phone] (3+ muscles)- Dry Needling, Patient/Family education, Balance training, Stair training, Taping, Joint mobilization, Joint manipulation, Spinal manipulation, Spinal mobilization, Cryotherapy, and Moist heat.  PLAN FOR NEXT SESSION: low back and hip stretching, core strengthening    Tanda KANDICE Sorrow, PTA 09/06/2023, 7:54 AM

## 2023-09-12 ENCOUNTER — Ambulatory Visit: Admitting: Physical Therapy

## 2023-09-12 DIAGNOSIS — G8929 Other chronic pain: Secondary | ICD-10-CM

## 2023-09-12 DIAGNOSIS — M6281 Muscle weakness (generalized): Secondary | ICD-10-CM

## 2023-09-12 DIAGNOSIS — M5459 Other low back pain: Secondary | ICD-10-CM

## 2023-09-12 DIAGNOSIS — M545 Low back pain, unspecified: Secondary | ICD-10-CM | POA: Diagnosis not present

## 2023-09-12 NOTE — Therapy (Signed)
 OUTPATIENT PHYSICAL THERAPY THORACOLUMBAR TREATMENT   Patient Name: Kevin Holloway MRN: [id number] DOB:Jul 26, 2005, 18 y.o., male Today's Date: 09/12/2023  END OF SESSION:  PT End of Session - 09/12/23 1526     Visit Number 8    Date for PT Re-Evaluation 09/25/23    Authorization Type [place] [employer]    PT Start Time 1530    PT Stop Time 1610    PT Time Calculation (min) 40 min            Past Medical History:  Diagnosis Date   Asthma    Myocarditis (HCC)    No past surgical history on file. 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   Musculoskeletal back pain 05/13/2023   Eczema 05/13/2023    PCP: Corrina Sabin  REFERRING PROVIDER: Corrina Sabin  REFERRING DIAG:  M54.9 (ICD-10-CM) - Musculoskeletal back pain    Rationale for Evaluation and Treatment: Rehabilitation  THERAPY DIAG:  Chronic bilateral low back pain without sciatica  Other low back pain  Muscle weakness (generalized)  ONSET DATE: 3 years   SUBJECTIVE:                                                                                                                                                                                           SUBJECTIVE STATEMENT: Doing well. No pain. 60-75% overall improved. Prolonged standing > 7 min causes pain PERTINENT HISTORY:  The patient, with a history of asthma, eczema and myocarditis, is establishing care with the new clinic. The patient has been experiencing back pain for about two years, which worsens with standing or walking for more than eight minutes. The pain, rated as a 5/10, is located in the middle of the lower back and does not radiate down the legs. The patient finds relief by sitting and resting.  The patient's mother also reports that the patient has been having sleep issues.  He is unable to fall asleep until early hours of the morning stays asleep until 4 into the morning.  Denies caffeine intake or  daytime naps.  The patient has a history of chest pain and was seen in the emergency room for this in 02/2023 and myocarditis was ruled out at that time.   For his eczema he uses triamcinolone and for his asthma he is on Ventolin  as needed but not on any maintenance treatment.  He uses cetirizine  and Flonase  for seasonal allergies.  PAIN:  Are you having pain? Yes: NPRS scale: 0/10 Pain location: low back  Pain description: ache type of pain Aggravating factors: standing too long, sitting  for too long, walk too much Relieving factors: resting or laying down  PRECAUTIONS: None  RED FLAGS: None   WEIGHT BEARING RESTRICTIONS: No  FALLS:  Has patient fallen in last 6 months? 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  DIAGNOSTIC FINDINGS:  FINDINGS: There is no evidence of lumbar spine fracture. Levocurvature of spine. Intervertebral disc spaces are maintained.   IMPRESSION: No acute fracture or dislocation. Levocurvature of spine.  PATIENT SURVEYS:  Modified Oswestry 13/50 = 26%   COGNITION: Overall cognitive status: Within functional limits for tasks assessed     SENSATION: WFL  MUSCLE LENGTH: Hamstrings: very tight in BLE   POSTURE: No Significant postural limitations  PALPATION: No TTP, some tightness in paraspinals   LUMBAR ROM:   AROM eval 08/15/23  Flexion Distal shin no pain just tight WFL  Extension [facility] WFL  Right lateral flexion [facility] WFL  Left lateral flexion WFL WFL  Right rotation [facility] WFL  Left rotation 75% WFL   (Blank rows = not tested)  LOWER EXTREMITY ROM:  all WFL    LOWER EXTREMITY MMT:  5/5   LUMBAR SPECIAL TESTS:  Straight leg  raise test: Positive and FABER test: Negative  FUNCTIONAL TESTS:  5 times sit to stand: 11s    TREATMENT DATE:  09/12/23 Nustep L 6  Standing shld ext 15# 2 sets 10, rows 2 sets 10 15# AR press and circles 15 x each BIL 15 sec planks x4 Swimming 45sec 2 times Inclined plank with alt hip ext 20 x 2 sets Dead lift into upright row 6# 2 sets 10 Standing blue ball OH ext 10 x, rotation 15 x  8# OH marching 1 lap each hand Lats and Rows 15 x 2 sets 45# Bridges with feet on ball 15 x then SL 10 x each  09/06/23 NuStep L5x 7 mins  S2S OHP 10lb dumbbell 2x10 15 sec planks x4 Rows & Lats 45lb 2x10 Leg press 100lb 2x12 Shoulder Ext 10lb 2x12 Standing military press 3x10 8lb HS curls 45lb 2x15 Leg Ext 15lb 2x10 Bridges 2x10   08/28/23 NuStep L5x 7 mins  Rows & Lats 45lb 2x10 Leg press 80lb 2x15 5 ten sec planks   20 sec but shaky Bridges x10 LE on Pball bridges, K2C, Oblq Pball crunches 2x15 Shoulder Ext 10lb 2x12 Sit to stand OHP blue ball 2x12 Standing military press 2x10 8lb    08/22/23 NuStep L5x52mins  Lateral flexion with 10# 2x10 Holding 10# marching both sides 2x10 Rows and Lats 35# 2x10 blackTB ext 2x10 blackTB flexion 2x10 Birddogs 2x10 alternating  Deadbugs 2x10 alternating  HS curls 35lb 3x10  Leg Ext 10lb 3x10 STS with OHP 5# dumbbells 2x10  08/15/23 NuStep L 5 x 6 min GOALS Rows & Lats 35lb 2x10 Shoulder Ext 10lb 2x10 AR press 20lb x10 each  HS curls 35lb 2x10  Leg Ext 10lb 2x10 Sit to stand OHP blue ball 2x10 Leg press 60lb 2x10   08/08/23 NuStep L5x38mins  Shoulder ext 10# 2x10 AR press 10# 2x10 Seated row 35# 2x10 Lat pull down 35# 2x10 blackTB ext 2x10  Leg press 40# 2x10 STS with OHP 2x10  Plank hold 10s x5  Lateral band steps green band    07/31/23 NuStep L 5 x 6 min Sit to stand w/ OHP 2x10 Seated Rows & Lats 35lb 2x10 Shoulder ext 10lb 2x10 Bridges 2x10 LE on Pball bridges, K2C, Oblq Modifies dead bugs 2x5 Bilateral LE  stretching to piriformis, HS, K2C, Glute  07/17/23  EVAL,HEP                                                                                                                                 PATIENT EDUCATION:  Education details: POC, HEP, anatomy  Person educated: Patient Education method: Explanation Education comprehension: verbalized understanding and returned demonstration  HOME EXERCISE PROGRAM: Access Code: [email or web address] URL: [email or web address] Date: 07/17/2023 Prepared by: Almetta Fam  Exercises - Supine Lower Trunk Rotation  - 2 x daily - 7 x weekly - 2 sets - 10 reps - Supine Bridge  - 2 x daily - 7 x weekly - 2 sets - 10 reps - Supine Single Knee to Chest Stretch  - 2 x daily - 7 x weekly - 2 sets - 2 reps - 15 hold - Seated Hamstring Stretch  - 1 x daily - 7 x weekly - 2 sets - 2 reps - 15 hold - Standard Plank  - 2 x daily - 7 x weekly - 2 sets - 5 reps - 10 hold  ASSESSMENT:  CLINICAL IMPRESSION: assessed  and documented goals. Progressed standing strengthening ex as standing is limited for pt to less than 10 min . Cuing with ex needed.    OBJECTIVE IMPAIRMENTS: decreased ROM, decreased strength, impaired flexibility, and pain.   ACTIVITY LIMITATIONS: carrying, lifting, bending, squatting, and locomotion level  PARTICIPATION LIMITATIONS: community activity, occupation, and yard work  PERSONAL FACTORS: Age, Behavior pattern, and Time since onset of injury/illness/exacerbation are also affecting patient's functional outcome.   REHAB POTENTIAL: Good  CLINICAL DECISION MAKING: Stable/uncomplicated  EVALUATION COMPLEXITY: Low   GOALS: Goals reviewed with patient? Yes  SHORT TERM GOALS: Target date: 08/21/23  Patient will be independent with initial HEP.  Baseline:  Goal status: MET 08/08/23   LONG TERM GOALS: Target date: 09/25/23  Patient will be independent with advanced/ongoing HEP to improve outcomes and carryover.  Baseline:  Goal  status: INITIAL  2.  Patient will report <2/10 pain in low back pain to improve QOL.  Baseline: 5/10 Goal status: Progressing 3/10 08/15/23, Ongoing 4/10 09/06/23   09/12/23 3/10 60-75% better  3.  Patient will demonstrate full pain free lumbar ROM to perform ADLs.   Baseline: small limitations with flexion and L rotation Goal status: Met 08/15/23  4.  Patient will demonstrate improved core strength as demonstrated by 20s plank. Baseline: 9s at best Goal status: Progressing 08/07/23   progressing 09/12/23  15 sec  5.  Patient will tolerate 30 min of standing/walking to perform household chores and ADLs. Baseline: 10 min max Goal status: Progressing 08/15/23, ongoing 08/28/23 8 min  progressing 7 min per pt report 09/12/23   PLAN:  PT FREQUENCY: 1x/week  PT DURATION: 10 weeks  PLANNED INTERVENTIONS: 97110-Therapeutic exercises, 97530- Therapeutic activity, 97112- Neuromuscular re-education, 97535- Self Care, [id number]- Manual therapy, [id number]- Electrical stimulation (unattended), [phone]- Traction (mechanical), [phone] (1-2 muscles), [phone] (3+ muscles)- Dry Needling, Patient/Family education, Balance training, Stair training, Taping, Joint mobilization, Joint manipulation, Spinal manipulation, Spinal mobilization, Cryotherapy, and Moist heat.  PLAN FOR NEXT SESSION: standing ex to increase standing tolerance. core strengthening    Arelly Whittenberg,ANGIE, PTA 09/12/2023, 3:28 PM

## 2023-09-19 ENCOUNTER — Ambulatory Visit: Admitting: Physical Therapy

## 2023-09-19 ENCOUNTER — Encounter: Payer: Self-pay | Admitting: Physical Therapy

## 2023-09-19 DIAGNOSIS — M545 Low back pain, unspecified: Secondary | ICD-10-CM

## 2023-09-19 DIAGNOSIS — M5459 Other low back pain: Secondary | ICD-10-CM

## 2023-09-19 DIAGNOSIS — M439 Deforming dorsopathy, unspecified: Secondary | ICD-10-CM

## 2023-09-19 DIAGNOSIS — M6281 Muscle weakness (generalized): Secondary | ICD-10-CM

## 2023-09-19 NOTE — Therapy (Signed)
 OUTPATIENT PHYSICAL THERAPY THORACOLUMBAR TREATMENT   Patient Name: Kevin Holloway MRN: [id number] DOB:04/30/2005, 18 y.o., male Today's Date: 09/19/2023  END OF SESSION:  PT End of Session - 09/19/23 1513     Visit Number 9    Date for PT Re-Evaluation 09/25/23    PT Start Time 1515    PT Stop Time 1600    PT Time Calculation (min) 45 min    Activity Tolerance Patient tolerated treatment well    Behavior During Therapy [facility] for tasks assessed/performed            Past Medical History:  Diagnosis Date   Asthma    Myocarditis (HCC)    History reviewed. No pertinent surgical history. 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   Musculoskeletal back pain 05/13/2023   Eczema 05/13/2023    PCP: Corrina Sabin  REFERRING PROVIDER: Corrina Sabin  REFERRING DIAG:  M54.9 (ICD-10-CM) - Musculoskeletal back pain    Rationale for Evaluation and Treatment: Rehabilitation  THERAPY DIAG:  Chronic bilateral low back pain without sciatica  Other low back pain  Muscle weakness (generalized)  Curvature of spine  ONSET DATE: 3 years   SUBJECTIVE:                                                                                                                                                                                           SUBJECTIVE STATEMENT: Good  PERTINENT HISTORY:  The patient, with a history of asthma, eczema and myocarditis, is establishing care with the new clinic. The patient has been experiencing back pain for about two years, which worsens with standing or walking for more than eight minutes. The pain, rated as a 5/10, is located in the middle of the lower back and does not radiate down the legs. The patient finds relief by sitting and resting.  The patient's mother also reports that the patient has been having sleep issues.  He is unable to fall asleep until early hours of the morning stays asleep until 4  into the morning.  Denies caffeine intake or daytime naps.  The patient has a history of chest pain and was seen in the emergency room for this in 02/2023 and myocarditis was ruled out at that time.   For his eczema he uses triamcinolone and for his asthma he is on Ventolin as needed but not on any maintenance treatment.  He uses cetirizine and Flonase for seasonal allergies.  PAIN:  Are you having pain? Yes: NPRS scale: 0/10 Pain location: low back  Pain description: ache type of pain Aggravating factors:  standing too long, sitting for too long, walk too much Relieving factors: resting or laying down  PRECAUTIONS: None  RED FLAGS: None   WEIGHT BEARING RESTRICTIONS: No  FALLS:  Has patient fallen in last 6 months? 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  DIAGNOSTIC FINDINGS:  FINDINGS: There is no evidence of lumbar spine fracture. Levocurvature of spine. Intervertebral disc spaces are maintained.   IMPRESSION: No acute fracture or dislocation. Levocurvature of spine.  PATIENT SURVEYS:  Modified Oswestry 13/50 = 26%   COGNITION: Overall cognitive status: Within functional limits for tasks assessed     SENSATION: WFL  MUSCLE LENGTH: Hamstrings: very tight in BLE   POSTURE: No Significant postural limitations  PALPATION: No TTP, some tightness in paraspinals   LUMBAR ROM:   AROM eval 08/15/23  Flexion Distal shin no pain just tight WFL  Extension [facility] WFL  Right lateral flexion [facility] WFL  Left lateral flexion WFL WFL  Right rotation [facility] WFL  Left rotation 75% WFL   (Blank rows = not tested)  LOWER EXTREMITY ROM:  all WFL    LOWER EXTREMITY MMT:   5/5   LUMBAR SPECIAL TESTS:  Straight leg raise test: Positive and FABER test: Negative  FUNCTIONAL TESTS:  5 times sit to stand: 11s    TREATMENT DATE: 09/19/23 Nustep L 6  Gait outside around back building around widest part of parking lot up and down slope  No pain Seated Rows & Lats 45lb 2x10 Hip Ext 10lb 2x10 Shoulder ext 15lb 2x10 Plank 30 sec x 4 Sit to stand OHP blue ball 2x10  09/12/23 NuStep L 6  Standing shld ext 15# 2 sets 10, rows 2 sets 10 15# AR press and circles 15 x each BIL 15 sec planks x4 Swimming 45sec 2 times Inclined plank with alt hip ext 20 x 2 sets Dead lift into upright row 6# 2 sets 10 Standing blue ball OH ext 10 x, rotation 15 x  8# OH marching 1 lap each hand Lats and Rows 15 x 2 sets 45# Bridges with feet on ball 15 x then SL 10 x each  09/06/23 NuStep L5x 7 mins  S2S OHP 10lb dumbbell 2x10 15 sec planks x4 Rows & Lats 45lb 2x10 Leg press 100lb 2x12 Shoulder Ext 10lb 2x12 Standing military press 3x10 8lb HS curls 45lb 2x15 Leg Ext 15lb 2x10 Bridges 2x10   08/28/23 NuStep L5x 7 mins  Rows & Lats 45lb 2x10 Leg press 80lb 2x15 5 ten sec planks   20 sec but shaky Bridges x10 LE on Pball bridges, K2C, Oblq Pball crunches 2x15 Shoulder Ext 10lb 2x12 Sit to stand OHP blue ball 2x12 Standing military press 2x10 8lb    08/22/23 NuStep L5x86mins  Lateral flexion with 10# 2x10 Holding 10# marching both sides 2x10 Rows and Lats 35# 2x10 blackTB ext 2x10 blackTB flexion 2x10 Birddogs 2x10 alternating  Deadbugs 2x10 alternating  HS curls 35lb 3x10  Leg Ext 10lb 3x10 STS with OHP 5# dumbbells 2x10  08/15/23 NuStep L 5 x 6 min GOALS Rows & Lats 35lb 2x10 Shoulder Ext 10lb 2x10 AR press 20lb x10 each  HS curls 35lb 2x10  Leg  Ext 10lb 2x10 Sit to stand OHP blue ball 2x10 Leg press 60lb 2x10   08/08/23 NuStep L5x8mins  Shoulder ext 10# 2x10 AR press 10# 2x10 Seated row 35# 2x10 Lat pull down 35# 2x10 blackTB ext  2x10  Leg press 40# 2x10 STS with OHP 2x10  Plank hold 10s x5  Lateral band steps green band    07/31/23 NuStep L 5 x 6 min Sit to stand w/ OHP 2x10 Seated Rows & Lats 35lb 2x10 Shoulder ext 10lb 2x10 Bridges 2x10 LE on Pball bridges, K2C, Oblq Modifies dead bugs 2x5 Bilateral LE stretching to piriformis, HS, K2C, Glute  07/17/23  EVAL,HEP                                                                                                                                 PATIENT EDUCATION:  Education details: POC, HEP, anatomy  Person educated: Patient Education method: Explanation Education comprehension: verbalized understanding and returned demonstration  HOME EXERCISE PROGRAM: Access Code: [email or web address] URL: [email or web address] Date: 07/17/2023 Prepared by: Almetta Fam  Exercises - Supine Lower Trunk Rotation  - 2 x daily - 7 x weekly - 2 sets - 10 reps - Supine Bridge  - 2 x daily - 7 x weekly - 2 sets - 10 reps - Supine Single Knee to Chest Stretch  - 2 x daily - 7 x weekly - 2 sets - 2 reps - 15 hold - Seated Hamstring Stretch  - 1 x daily - 7 x weekly - 2 sets - 2 reps - 15 hold - Standard Plank  - 2 x daily - 7 x weekly - 2 sets - 5 reps - 10 hold  ASSESSMENT:  CLINICAL IMPRESSION: Pt enters doing well. Pt has progressed meeting most goals. Progressed to outdoor ambulation up and down slope without issue. All interventions completed well. Pt reports no functional limitations.     OBJECTIVE IMPAIRMENTS: decreased ROM, decreased strength, impaired flexibility, and pain.   ACTIVITY LIMITATIONS: carrying, lifting, bending, squatting, and locomotion level  PARTICIPATION LIMITATIONS: community activity, occupation, and yard work  PERSONAL FACTORS: Age, Behavior pattern, and Time since onset of injury/illness/exacerbation are also affecting patient's functional outcome.   REHAB POTENTIAL: Good  CLINICAL DECISION MAKING: Stable/uncomplicated  EVALUATION  COMPLEXITY: Low   GOALS: Goals reviewed with patient? Yes  SHORT TERM GOALS: Target date: 08/21/23  Patient will be independent with initial HEP.  Baseline:  Goal status: MET 08/08/23   LONG TERM GOALS: Target date: 09/25/23  Patient will be independent with advanced/ongoing HEP to improve outcomes and carryover.  Baseline:  Goal status: INITIAL  2.  Patient will report <2/10 pain in low back pain to improve QOL.  Baseline: 5/10 Goal status: Progressing 3/10 08/15/23, Ongoing 4/10 09/06/23   09/12/23 3/10 60-75% better, Met 09/19/23  3.  Patient will demonstrate full pain free lumbar ROM to perform ADLs.   Baseline: small limitations with flexion and L rotation  Goal status: Met 08/15/23  4.  Patient will demonstrate improved core strength as demonstrated by 20s plank. Baseline: 9s at best Goal status: Progressing 08/07/23   progressing 09/12/23  15 sec, Met 814/25  5.  Patient will tolerate 30 min of standing/walking to perform household chores and ADLs. Baseline: 10 min max Goal status: Progressing 08/15/23, ongoing 08/28/23 8 min   progressing 7 min per pt report 09/12/23   PLAN:  PT FREQUENCY: 1x/week  PT DURATION: 10 weeks  PLANNED INTERVENTIONS: 97110-Therapeutic exercises, 97530- Therapeutic activity, 97112- Neuromuscular re-education, 97535- Self Care, 02859- Manual therapy, G0283- Electrical stimulation (unattended), [phone]- Traction (mechanical), 20560 (1-2 muscles), 20561 (3+ muscles)- Dry Needling, Patient/Family education, Balance training, Stair training, Taping, Joint mobilization, Joint manipulation, Spinal manipulation, Spinal mobilization, Cryotherapy, and Moist heat.  PLAN FOR NEXT SESSION: D/C PT   Tanda KANDICE Sorrow, PTA 09/19/2023, 3:14 PM

## 2023-09-26 ENCOUNTER — Ambulatory Visit: Admitting: Physical Therapy

## 2023-10-15 NOTE — Progress Notes (Unsigned)
 Cardiology Office Note:    Date:  10/15/2023  NAME:  Kevin Holloway    MRN: [id number] DOB:  03/21/2005   PCP:  Delbert Clam, MD  Former Cardiology Providers: *** Primary Cardiologist:  None, FACC (established care 10/15/2023) Electrophysiologist:  None   Referring MD: [facility] / Self Referral  Reason of Consult: ***  No chief complaint on file.   History of Present Illness:    Kevin Holloway is a 18 y.o. 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 He is being seen today to establish care / hx of myocarditis at the request of ***.  In 2024 he was evaluated at [facility] by Pediatric Cardiologist and was under the care of Dr. Maribeth Salaam (note dated 08/03/2022 reviewed as part of today's visit). Following paragarph obtain from Dr. Darilyn note to summarize his cardiovascular history as follows.   Kevin Holloway was admitted from 03/22/19-04/01/19. He initially presented with 3 weeks of intermittent fever and chest pain preceded by a viral illness to the [facility] ED. In the ED his Troponin was significantly elevated, diffuse ST segment changes on his ECG, and mildly decreased systolic function. He was then transferred to [facility] for further evaluation. A myocarditis panel was sent and had weakly positive coxsackie titers as well as SARS-CoV-2 Antibody IgG positive. Presentation consistent with MIS-C. His echocardiogram prior to treatment demonstrated moderate LV dysfunction and small coronary artery aneurysms (right coronary artery and proximal LAD dilated with Z-scores >+3). He received IVIG x 4 doses and high dose IV steroids with improvement. He was discharged on a prednisone  taper and 325mg  Aspirin  daily. ECG at discharge with LAD, ST elevation and T wave  inversion in the inferior and lateral leads.  At there last office visit in 07/2022 patient was noted to elevated lipids and shared decision was to monitor him every 6 months for lipids and PDA.  ***  Current Medications: No outpatient medications have been marked as taking for the 10/16/23 encounter (Appointment) with Michele, Azana Kiesler, DO.     Allergies:    Patient has no known allergies.   Past Medical History: Past Medical History:  Diagnosis Date   Asthma    Myocarditis (HCC)     Past Surgical History: No past surgical history on file.  Social History: Social History   Tobacco Use   Smoking status: Never   Smokeless tobacco: Never  Vaping Use   Vaping status: Never Used  Substance Use Topics   Alcohol use: Never    Family History: Family History  Problem Relation Age of Onset   Healthy Mother    Healthy Father    Cancer Paternal Grandmother     ROS:   ROS  EKGs/Labs/Other Studies Reviewed:   EKG: EKG Interpretation Date/Time:    Ventricular Rate:    PR Interval:    QRS Duration:    QT Interval:    QTC Calculation:   R Axis:      Text Interpretation:    Echocardiogram: D/c echo 04/01/2019 Normal left ventricular systolic function, calculated 3D ejection fraction = 60%, Global longitudinal strain = -17.0% Normal right ventricular size and systolic function Trivial patent ductus arteriosus with left to right shunt Mildly dilated right coronary artery, z-score is +2.7 Mildly prominent left coronary and left anterior descending Left main and anterior descending  coronary artery measure within normal limits  Stress Testing:  Exercise stress test Jolynn Pack 11/02/19:  The exercise test demonstrated normal baseline ECG. There was an appropriate heart rate and blood pressure response to exercise. There were no arrhythmias or ST-T changes during exercise.   cMRI  11/17/19 1. Normal cardiac chamber size, thickness and biventricular systolic function without  significant change from prior scan.  2. There is extensive epicardial scar involving the left ventricular lateral and inferior segments from the level of the base to the level of the papillary muscles. This appears more extensive than the prior scan, although some of this difference may be technique-dependent.  3. Pre-contrast T1 mapping abnormalities are no longer present.   Heart Catheterization: ***  Labs:    Latest Ref Rng & Units 05/13/2023    9:27 AM 02/16/2023    7:33 PM 08/18/2022    6:55 PM  CBC  WBC 3.4 - 10.8 x10E3/uL 7.5  7.6  8.6   Hemoglobin 13.0 - 17.7 g/dL 84.5  83.8  85.0   Hematocrit 37.5 - 51.0 % 46.2  48.2  45.1   Platelets 150 - 450 x10E3/uL 341  332  316        Latest Ref Rng & Units 05/13/2023    9:27 AM 02/16/2023    7:33 PM 08/18/2022    6:55 PM  BMP  Glucose 70 - 99 mg/dL 89  94  899   BUN 6 - 20 mg/dL 13  10  7    Creatinine 0.76 - 1.27 mg/dL 8.94  8.90  8.89   BUN/Creat Ratio 9 - 20 12     Sodium 134 - 144 mmol/L 139  139  135   Potassium 3.5 - 5.2 mmol/L 4.4  4.1  3.8   Chloride 96 - 106 mmol/L 102  101  99   CO2 20 - 29 mmol/L 22  26  26    Calcium 8.7 - 10.2 mg/dL 89.9  89.8  9.5       Latest Ref Rng & Units 05/13/2023    9:27 AM 02/16/2023    7:33 PM 08/18/2022    6:55 PM  CMP  Glucose 70 - 99 mg/dL 89  94  899   BUN 6 - 20 mg/dL 13  10  7    Creatinine 0.76 - 1.27 mg/dL 8.94  8.90  8.89   Sodium 134 - 144 mmol/L 139  139  135   Potassium 3.5 - 5.2 mmol/L 4.4  4.1  3.8   Chloride 96 - 106 mmol/L 102  101  99   CO2 20 - 29 mmol/L 22  26  26    Calcium 8.7 - 10.2 mg/dL 89.9  89.8  9.5   Total Protein 6.0 - 8.5 g/dL 7.7     Total Bilirubin 0.0 - 1.2 mg/dL 0.3     Alkaline Phos 51 - 125 IU/L 88     AST 0 - 40 IU/L 30     ALT 0 - 44 IU/L 44       Lab Results  Component Value Date   CHOL 205 (H) 05/13/2023   HDL 41 05/13/2023   LDLCALC 135 (H) 05/13/2023   TRIG 162 (H) 05/13/2023   No results for input(s): LIPOA in the last 8760  hours. No components found for: NTPROBNP No results for input(s): PROBNP in the last 8760 hours. No results for input(s): TSH in the last 8760 hours.  Lipid Profile.  01/2022: Total 239, TAG 179, HDL,  48, LDL 158, VLDL 33mg /dL 93/7975: Total 224, TAG 138, HDL ??, LDL 155 (non fasting)   Physical Exam:   There were no vitals filed for this visit. There is no height or weight on file to calculate BMI. Wt Readings from Last 3 Encounters:  05/13/23 210 lb (95.3 kg) (95%, Z= 1.68)*  02/16/23 208 lb 12.4 oz (94.7 kg) (95%, Z= 1.67)*  08/18/22 208 lb 12.4 oz (94.7 kg) (96%, Z= 1.71)*   * Growth percentiles are based on CDC (Boys, 2-20 Years) data.    Physical Exam   Impression & Recommendation(s):  Impression: No diagnosis found.   Recommendation(s):  *** Activities: Kevin Holloway should be allowed to self-limit his activity as necessary. Medications: None  Testing: fasting lipid panel, CK, and CMP  SBE Prophylaxis: Not indicated. Follow-up: Follow-up in 6 months, or sooner if an indication arises.  Ped cards 08/03/2022 Darcey Harlem, MD   Orders Placed:  No orders of the defined types were placed in this encounter.    Final Medication List:   No orders of the defined types were placed in this encounter.   There are no discontinued medications.   Current Outpatient Medications:    albuterol  (VENTOLIN  HFA) 108 (90 Base) MCG/ACT inhaler, Inhale 2 puffs into the lungs every 4 (four) hours as needed., Disp: 18 g, Rfl: 6   cetirizine  (ZYRTEC ) 10 MG chewable tablet, Chew 1 tablet (10 mg total) by mouth daily., Disp: , Rfl:    fluticasone  (VERAMYST) 27.5 MCG/SPRAY nasal spray, Place 2 sprays into the nose daily., Disp: , Rfl:    ondansetron  (ZOFRAN ) 4 MG tablet, Take 1 tablet (4 mg total) by mouth every 8 (eight) hours as needed for nausea or vomiting., Disp: 12 tablet, Rfl: 0   UNKNOWN TO PATIENT, Eczema cream, Disp: , Rfl:   Consent:   ***  Disposition:   *** Patient may  be asked to follow-up sooner based on the results of the above-mentioned testing.  His questions and concerns were addressed to his satisfaction. He voices understanding of the recommendations provided during this encounter.    Signed, Madonna Michele HAS, [facility]  A Division of [facility] [address], [place], [place] [address]  10/15/2023 10:14 PM

## 2023-10-16 ENCOUNTER — Ambulatory Visit: Attending: Cardiology | Admitting: Cardiology

## 2023-10-16 ENCOUNTER — Encounter: Payer: Self-pay | Admitting: Cardiology

## 2023-10-16 VITALS — BP 114/73 | HR 66 | Resp 16 | Ht 69.0 in | Wt 214.2 lb

## 2023-10-16 DIAGNOSIS — I2541 Coronary artery aneurysm: Secondary | ICD-10-CM | POA: Insufficient documentation

## 2023-10-16 DIAGNOSIS — Q250 Patent ductus arteriosus: Secondary | ICD-10-CM | POA: Insufficient documentation

## 2023-10-16 DIAGNOSIS — E781 Pure hyperglyceridemia: Secondary | ICD-10-CM | POA: Diagnosis not present

## 2023-10-16 DIAGNOSIS — M3581 Multisystem inflammatory syndrome: Secondary | ICD-10-CM | POA: Diagnosis not present

## 2023-10-16 DIAGNOSIS — Z8679 Personal history of other diseases of the circulatory system: Secondary | ICD-10-CM | POA: Insufficient documentation

## 2023-10-16 DIAGNOSIS — E7800 Pure hypercholesterolemia, unspecified: Secondary | ICD-10-CM | POA: Diagnosis present

## 2023-10-16 NOTE — Patient Instructions (Signed)
 Medication Instructions:  Continue same   Lab Work: None ordered  Testing/Procedures: None ordered  Follow-Up: At [facility], you and your health needs are our priority.  As part of our continuing mission to provide you with exceptional heart care, our providers are all part of one team.  This team includes your primary Cardiologist (physician) and Advanced Practice Providers or APPs (Physician Assistants and Nurse Practitioners) who all work together to provide you with the care you need, when you need it.  Your next appointment:  As Needed    Provider:  Dr.Tolia  We recommend signing up for the patient portal called MyChart.  Sign up information is provided on this After Visit Summary.  MyChart is used to connect with patients for Virtual Visits (Telemedicine).  Patients are able to view lab/test results, encounter notes, upcoming appointments, etc.  Non-urgent messages can be sent to your provider as well.   To learn more about what you can do with MyChart, go to [email or web address].

## 2023-11-11 ENCOUNTER — Telehealth: Payer: Self-pay | Admitting: Family Medicine

## 2023-11-11 NOTE — Telephone Encounter (Signed)
 1st attempt contacted pt lvm to resch appt pcp not in the office

## 2023-11-12 ENCOUNTER — Ambulatory Visit: Admitting: Family Medicine

## 2023-12-09 ENCOUNTER — Ambulatory Visit: Attending: Family Medicine | Admitting: Family Medicine

## 2023-12-09 ENCOUNTER — Encounter: Payer: Self-pay | Admitting: Family Medicine

## 2023-12-09 ENCOUNTER — Other Ambulatory Visit: Payer: Self-pay

## 2023-12-09 VITALS — BP 111/67 | HR 67 | Temp 98.2°F | Ht 69.0 in | Wt 211.8 lb

## 2023-12-09 DIAGNOSIS — Z23 Encounter for immunization: Secondary | ICD-10-CM | POA: Diagnosis not present

## 2023-12-09 DIAGNOSIS — G4709 Other insomnia: Secondary | ICD-10-CM

## 2023-12-09 DIAGNOSIS — F419 Anxiety disorder, unspecified: Secondary | ICD-10-CM | POA: Diagnosis not present

## 2023-12-09 DIAGNOSIS — M549 Dorsalgia, unspecified: Secondary | ICD-10-CM

## 2023-12-09 MED ORDER — HYDROXYZINE HCL 25 MG PO TABS
25.0000 mg | ORAL_TABLET | Freq: Every evening | ORAL | 1 refills | Status: AC | PRN
  Filled 2023-12-09: qty 30, 30d supply, fill #0

## 2023-12-09 NOTE — Progress Notes (Signed)
 Subjective:  Patient ID: Kevin Holloway, male    DOB: [birth year]  Age: 18 y.o. MRN: [id number]  CC: Medical Management of Chronic Issues     Discussed the use of AI scribe software for clinical note transcription with the patient, who gave verbal consent to proceed.  History of Present Illness Kevin Holloway is a 18 year old male with a history of asthma, eczema and myocarditis, chronic back pain who presents for follow-up. He is accompanied by his mother.  Chronic back pain persists, currently rated as 3/10. Pain has slightly improved since the last visit six months ago. Physical therapy was attended until August, when insurance coverage ended. Pain is managed with Tylenol  and ibuprofen .  Asthma is stable with no recent exacerbations. He uses inhalers and has six refills of albuterol  available.  Eczema is managed with triamcinolone cream without recent changes.  Insomnia is ongoing, with difficulty sleeping at night. Anxiety is present, related to past history of myocarditis and family stress due to his uncle's stage four cancer diagnosis. Mood is described as sad and anxious by mom but the patient just states he is just anxious.    Past Medical History:  Diagnosis Date   Asthma    Myocarditis (HCC)     No past surgical history on file.  Family History  Problem Relation Age of Onset   Healthy Mother    Healthy Father    Heart disease Maternal Aunt    Cancer Paternal Grandmother    Stroke Paternal Grandmother     Social History   Socioeconomic History   Marital status: Single    Spouse name: Not on file   Number of children: Not on file   Years of education: Not on file   Highest education level: Not on file  Occupational History   Not on file  Tobacco Use   Smoking status: Never   Smokeless tobacco: Never  Vaping Use   Vaping status: Never Used  Substance and Sexual Activity   Alcohol use: Never   Drug use: Never   Sexual activity: Never  Other Topics  Concern   Not on file  Social History Narrative   Not on file   Social Drivers of Health   Financial Resource Strain: Medium Risk (05/13/2023)   Overall Financial Resource Strain (CARDIA)    Difficulty of Paying Living Expenses: Somewhat hard  Food Insecurity: Food Insecurity Present (05/24/2023)   Hunger Vital Sign    Worried About Running Out of Food in the Last Year: Sometimes true    Ran Out of Food in the Last Year: Sometimes true  Transportation Needs: No Transportation Needs (05/24/2023)   PRAPARE - Administrator, Civil Service (Medical): No    Lack of Transportation (Non-Medical): No  Recent Concern: Transportation Needs - Unmet Transportation Needs (05/13/2023)   PRAPARE - Transportation    Lack of Transportation (Medical): Yes    Lack of Transportation (Non-Medical): Yes  Physical Activity: Patient Declined (05/13/2023)   Exercise Vital Sign    Days of Exercise per Week: Patient declined    Minutes of Exercise per Session: Patient declined  Stress: Stress Concern Present (05/13/2023)   [employer] of Occupational Health - Occupational Stress Questionnaire    Feeling of Stress : To some extent  Social Connections: Moderately Integrated (05/13/2023)   Social Connection and Isolation Panel    Frequency of Communication with Friends and Family: Twice a week    Frequency of Social Gatherings with  Friends and Family: Twice a week    Attends Religious Services: 1 to 4 times per year    Active Member of [employer] or Organizations: Yes    Attends Banker Meetings: Never    Marital Status: Never married    No Known Allergies  Outpatient Medications Prior to Visit  Medication Sig Dispense Refill   albuterol  (VENTOLIN  HFA) 108 (90 Base) MCG/ACT inhaler Inhale 2 puffs into the lungs every 4 (four) hours as needed. 18 g 6   cetirizine  (ZYRTEC ) 10 MG chewable tablet Chew 1 tablet (10 mg total) by mouth daily.     fluticasone  (VERAMYST) 27.5 MCG/SPRAY nasal spray  Place 2 sprays into the nose daily.     ondansetron  (ZOFRAN ) 4 MG tablet Take 1 tablet (4 mg total) by mouth every 8 (eight) hours as needed for nausea or vomiting. 12 tablet 0   triamcinolone ointment (KENALOG) 0.1 % Apply 1 Application topically daily.     No facility-administered medications prior to visit.     ROS Review of Systems  Constitutional:  Negative for activity change and appetite change.  HENT:  Negative for sinus pressure and sore throat.   Respiratory:  Negative for chest tightness, shortness of breath and wheezing.   Cardiovascular:  Negative for chest pain and palpitations.  Gastrointestinal:  Negative for abdominal distention, abdominal pain and constipation.  Genitourinary: Negative.   Musculoskeletal: Negative.   Psychiatric/Behavioral:  Positive for sleep disturbance. Negative for behavioral problems and dysphoric mood.        Positive for anxiety    Objective:  BP 111/67   Pulse 67   Temp 98.2 F (36.8 C) (Oral)   Ht 5' 9 (1.753 m)   Wt 211 lb 12.8 oz (96.1 kg)   SpO2 100%   BMI 31.28 kg/m      12/09/2023    3:33 PM 10/16/2023    3:32 PM 05/13/2023    8:48 AM  BP/Weight  Systolic BP 111 114 118  Diastolic BP 67 73 79  Wt. (Lbs) 211.8 214.2 210  BMI 31.28 kg/m2 31.63 kg/m2 31.01 kg/m2      Physical Exam Constitutional:      Appearance: He is well-developed.  Cardiovascular:     Rate and Rhythm: Normal rate.     Heart sounds: Normal heart sounds. No murmur heard. Pulmonary:     Effort: Pulmonary effort is normal.     Breath sounds: Normal breath sounds. No wheezing or rales.  Chest:     Chest wall: No tenderness.  Abdominal:     General: Bowel sounds are normal. There is no distension.     Palpations: Abdomen is soft. There is no mass.     Tenderness: There is no abdominal tenderness.  Musculoskeletal:        General: Normal range of motion.     Right lower leg: No edema.     Left lower leg: No edema.  Neurological:     Mental  Status: He is alert and oriented to person, place, and time.  Psychiatric:        Mood and Affect: Mood normal.        Latest Ref Rng & Units 05/13/2023    9:27 AM 02/16/2023    7:33 PM 08/18/2022    6:55 PM  CMP  Glucose 70 - 99 mg/dL 89  94  899   BUN 6 - 20 mg/dL 13  10  7    Creatinine 0.76 - 1.27 mg/dL 8.94  1.09  1.10   Sodium 134 - 144 mmol/L 139  139  135   Potassium 3.5 - 5.2 mmol/L 4.4  4.1  3.8   Chloride 96 - 106 mmol/L 102  101  99   CO2 20 - 29 mmol/L 22  26  26    Calcium 8.7 - 10.2 mg/dL 89.9  89.8  9.5   Total Protein 6.0 - 8.5 g/dL 7.7     Total Bilirubin 0.0 - 1.2 mg/dL 0.3     Alkaline Phos 51 - 125 IU/L 88     AST 0 - 40 IU/L 30     ALT 0 - 44 IU/L 44       Lipid Panel     Component Value Date/Time   CHOL 205 (H) 05/13/2023 0927   TRIG 162 (H) 05/13/2023 0927   HDL 41 05/13/2023 0927   LDLCALC 135 (H) 05/13/2023 0927    CBC    Component Value Date/Time   WBC 7.5 05/13/2023 0927   WBC 7.6 02/16/2023 1933   RBC 5.36 05/13/2023 0927   RBC 5.60 02/16/2023 1933   HGB 15.4 05/13/2023 0927   HCT 46.2 05/13/2023 0927   PLT 341 05/13/2023 0927   MCV 86 05/13/2023 0927   MCH 28.7 05/13/2023 0927   MCH 28.8 02/16/2023 1933   MCHC 33.3 05/13/2023 0927   MCHC 33.4 02/16/2023 1933   RDW 12.5 05/13/2023 0927   LYMPHSABS 2.7 05/13/2023 0927   MONOABS 0.6 10/06/2021 2115   EOSABS 0.2 05/13/2023 0927   BASOSABS 0.0 05/13/2023 9072    Lab Results  Component Value Date   HGBA1C 5.6 05/13/2023       Assessment & Plan Musculoskeletal back pain Persistent ache rated at 3/10. Previous physical therapy beneficial but discontinued due to insurance. - Continue Tylenol  or ibuprofen  for pain management. - Consider re-referral to physical therapy early next year if pain worsens.   Insomnia and anxiety Chronic insomnia and anxiety exacerbated by family stressors. Prefers medication over therapy. Hydroxyzine chosen for non-addictive nature and compatibility  with cardiac condition. - Referred to [facility] for counseling. - Prescribed hydroxyzine for anxiety and sleep, to be taken once daily at night as needed.  General Health Maintenance Flu vaccination was due. Need for influenza vaccine- Administered flu shot today.      Meds ordered this encounter  Medications   hydrOXYzine (ATARAX) 25 MG tablet    Sig: Take 1 tablet (25 mg total) by mouth at bedtime as needed.    Dispense:  30 tablet    Refill:  1    Follow-up: Return in about 6 months (around 06/07/2024) for Chronic medical conditions.       Corrina Sabin, MD, FAAFP. [facility] [place], [place] [phone]   12/09/2023, 5:14 PM

## 2023-12-09 NOTE — Patient Instructions (Signed)
 VISIT SUMMARY:  Today, you came in for a follow-up appointment to discuss your chronic back pain, asthma, eczema, insomnia, and anxiety. You were accompanied by your mother. We reviewed your current symptoms and made some adjustments to your treatment plan.  YOUR PLAN:  -CHRONIC LOWER BACK PAIN: Chronic lower back pain is a long-term ache in the lower back. Your pain is currently rated at 3/10 and has slightly improved since your last visit. You should continue using Tylenol  or ibuprofen  for pain management. If your pain worsens, we may consider referring you back to physical therapy early next year.  -ASTHMA: Asthma is a condition where your airways narrow and swell, making it difficult to breathe. Your asthma is well-controlled with your current inhaler regimen. Please continue using your inhalers as prescribed.  -ECZEMA: Eczema is a condition that makes your skin red and itchy. Your eczema is being managed with triamcinolone cream. Continue using the cream as directed.  -INSOMNIA AND ANXIETY: Insomnia is difficulty falling or staying asleep, and anxiety is excessive worry. Your insomnia and anxiety are related to family stressors. You have been prescribed hydroxyzine, which is non-addictive and safe for your heart condition, to help with anxiety and sleep. Take it once daily at night as needed. You have also been referred to [facility] for counseling.  -GENERAL HEALTH MAINTENANCE: You were due for a flu vaccination, which was administered today. Keeping up with vaccinations is important for your overall health.  INSTRUCTIONS:  Please follow up with us  if your back pain worsens or if you have any issues with your medications. Make sure to attend your counseling sessions at [facility] Continue using your inhalers and triamcinolone cream as prescribed. Take hydroxyzine at night as needed for anxiety and sleep. If you have any questions or concerns, do not hesitate  to contact our office.

## 2023-12-16 ENCOUNTER — Telehealth: Payer: Self-pay

## 2023-12-16 NOTE — Progress Notes (Unsigned)
 Complex Care Management Note Care Guide Note  12/16/2023 Name: Kevin Holloway MRN: [id number] DOB: Jul 22, 2005   Complex Care Management Outreach Attempts: An unsuccessful telephone outreach was attempted today to offer the patient information about available complex care management services.  Follow Up Plan:  Additional outreach attempts will be made to offer the patient complex care management information and services.   Encounter Outcome:  No Answer-Left voicemail  Leotis Rase [facility], [facility] Guide  Direct Dial: [phone]  Fax [phone]

## 2023-12-18 NOTE — Progress Notes (Signed)
 Complex Care Management Note Care Guide Note  12/18/2023 Name: ISHAQ MAFFEI MRN: [id number] DOB: [birth year]   Complex Care Management Outreach Attempts: A second unsuccessful outreach was attempted today to offer the patient with information about available complex care management services.  Follow Up Plan:  Additional outreach attempts will be made to offer the patient complex care management information and services.   Encounter Outcome:  No Answer-Left voicemail  Leotis Rase [facility], [facility] Guide  Direct Dial: [phone]  Fax [phone]

## 2023-12-19 NOTE — Progress Notes (Signed)
 Complex Care Management Note Care Guide Note  12/19/2023 Name: Kevin Holloway MRN: [id number] DOB: [birth year]   Complex Care Management Outreach Attempts: A third unsuccessful outreach was attempted today to offer the patient with information about available complex care management services.  Follow Up Plan:  No further outreach attempts will be made at this time. We have been unable to contact the patient to offer or enroll patient in complex care management services.  Encounter Outcome:  No Answer-Left voicemail  Leotis Rase [facility], [facility] Guide  Direct Dial: [phone]  Fax [phone]

## 2024-01-09 ENCOUNTER — Telehealth: Payer: Self-pay

## 2024-01-09 NOTE — Progress Notes (Signed)
 Complex Care Management Note Care Guide Note  01/09/2024 Name: Kevin Holloway MRN: [id number] DOB: February 02, 2006   Complex Care Management Outreach Attempts: An unsuccessful telephone outreach was attempted today to offer the patient information about available complex care management services.  Follow Up Plan:  No further outreach attempts will be made at this time. We have been unable to contact the patient to offer or enroll patient in complex care management services.  Encounter Outcome:  No Answer-Left voicemail  Leotis Rase [facility], [facility] Guide  Direct Dial: [phone]  Fax [phone]
